# Patient Record
Sex: Male | Born: 1960 | Hispanic: No | Smoking: Current every day smoker
Health system: Southern US, Community
[De-identification: ages and names within clinical notes are randomized; demographics above are authoritative.]

## PROBLEM LIST (undated history)

## (undated) DIAGNOSIS — M1990 Unspecified osteoarthritis, unspecified site: Secondary | ICD-10-CM

## (undated) DIAGNOSIS — S329XXA Fracture of unspecified parts of lumbosacral spine and pelvis, initial encounter for closed fracture: Secondary | ICD-10-CM

## (undated) DIAGNOSIS — K449 Diaphragmatic hernia without obstruction or gangrene: Secondary | ICD-10-CM

## (undated) HISTORY — PX: CERVICAL FUSION: SHX112

---

## 2008-10-10 ENCOUNTER — Inpatient Hospital Stay (HOSPITAL_COMMUNITY): Admission: AD | Admit: 2008-10-10 | Discharge: 2008-10-11 | Payer: Self-pay | Admitting: Cardiology

## 2010-10-10 LAB — MAGNESIUM: Magnesium: 2.3 mg/dL (ref 1.5–2.5)

## 2010-10-10 LAB — D-DIMER, QUANTITATIVE: D-Dimer, Quant: <0.22 ug/mL-FEU (ref 0.00–0.48)

## 2010-10-10 LAB — CBC
HCT: 45.5 % (ref 39.0–52.0)
HCT: 46.1 % (ref 39.0–52.0)
Hemoglobin: 15.8 g/dL (ref 13.0–17.0)
Hemoglobin: 15.8 g/dL (ref 13.0–17.0)
MCHC: 34.4 g/dL (ref 30.0–36.0)
MCHC: 34.7 g/dL (ref 30.0–36.0)
Platelets: 176 10*3/uL (ref 150–400)
RDW: 13 % (ref 11.5–15.5)
RDW: 13.1 % (ref 11.5–15.5)

## 2010-10-10 LAB — HEMOGLOBIN A1C: Mean Plasma Glucose: 108 mg/dL

## 2010-10-10 LAB — LIPID PANEL
Cholesterol: 174 mg/dL (ref 0–200)
LDL Cholesterol: 122 mg/dL — ABNORMAL HIGH (ref 0–99)
Total CHOL/HDL Ratio: 4.5 RATIO

## 2010-10-10 LAB — DIFFERENTIAL
Basophils Relative: 0 % (ref 0–1)
Lymphocytes Relative: 34 % (ref 12–46)
Lymphs Abs: 3.3 10*3/uL (ref 0.7–4.0)
Monocytes Relative: 8 % (ref 3–12)
Neutro Abs: 5.4 10*3/uL (ref 1.7–7.7)
Neutrophils Relative %: 56 % (ref 43–77)

## 2010-10-10 LAB — COMPREHENSIVE METABOLIC PANEL
BUN: 15 mg/dL (ref 6–23)
Calcium: 9.6 mg/dL (ref 8.4–10.5)
Creatinine, Ser: 1.23 mg/dL (ref 0.4–1.5)
Glucose, Bld: 85 mg/dL (ref 70–99)
Total Protein: 6.2 g/dL (ref 6.0–8.3)

## 2010-10-10 LAB — PROTIME-INR: Prothrombin Time: 12.9 seconds (ref 11.6–15.2)

## 2010-10-10 LAB — CARDIAC PANEL(CRET KIN+CKTOT+MB+TROPI)
CK, MB: 2.6 ng/mL (ref 0.3–4.0)
Relative Index: 2.2 (ref 0.0–2.5)
Relative Index: INVALID (ref 0.0–2.5)
Troponin I: <0.01 ng/mL (ref 0.00–0.06)
Troponin I: <0.01 ng/mL (ref 0.00–0.06)

## 2010-10-10 LAB — LIPASE, BLOOD: Lipase: 22 U/L (ref 11–59)

## 2010-10-10 LAB — TSH: TSH: 1.947 u[IU]/mL (ref 0.350–4.500)

## 2010-11-16 NOTE — Discharge Summary (Signed)
Lawrence Ward, Lawrence Ward               ACCOUNT NO.:  [id number]   MEDICAL RECORD NO.:  [id number]          PATIENT TYPE:  INP   LOCATION:  2919                         FACILITY:  [facility]   PHYSICIAN:  Armanda Magic, M.D.     DATE OF BIRTH:  September 07, 1960   DATE OF ADMISSION:  10/10/2008  DATE OF DISCHARGE:  10/11/2008                               DISCHARGE SUMMARY   DISCHARGE DIAGNOSES:  1. Chest pain, negative enzymes, negative Cardiolite, felt to be      noncardiac in nature.  2. Dyslipidemia.  3. History of smoking, smoking cessation counseling.  4. Hiatal hernia.   HOSPITAL COURSE:  [name] is a 50 year old male patient who was seen  in the office on October 10, 2008, as a referral from Dr. Para March with  chest tightness.  He had symptoms earlier in the day that did not  respond to sublingual nitroglycerin or a GI cocktail.  He was still  having chest pain while he was in our office and therefore we admitted  him to the hospital.   Part of his cardiac workup included cardiac isoenzymes, which were  negative.  TSH was 1.947.  Hemoglobin A1c 5.4.  Total cholesterol 174,  triglycerides 64, LDL 122.  Magnesium 2.3.  Serum amylase 51, lipase 22.  Sodium 141, potassium 4.5, BUN 15, creatinine 1.23.  Hemoglobin 15.8,  hematocrit 46.1, white count 9.7, platelets 176.   A stress test was performed and this was normal without any evidence of  exercise-induced myocardial ischemia.  The EF was calculated at 61%.  At  this point, we felt it was safe for the patient to go home.   DISCHARGE MEDICATIONS:  Prilosec over the counter 1 tablet daily.   He is to remain on a low-sodium, heart-healthy, low-fat diet.  Increase  activity slowly.  Return to see Dr. Para March in 2 weeks and he will also  need an appointment with a GI specialist.      Guy Franco, P.A.      Armanda Magic, M.D.  Electronically Signed    LB/MEDQ  D:  12/02/2008  T:  12/03/2008  Job:  [id number]

## 2014-05-18 ENCOUNTER — Encounter (HOSPITAL_COMMUNITY): Payer: Self-pay | Admitting: Neurology

## 2014-05-18 ENCOUNTER — Emergency Department (HOSPITAL_COMMUNITY): Payer: BC Managed Care – PPO

## 2014-05-18 ENCOUNTER — Inpatient Hospital Stay (HOSPITAL_COMMUNITY)
Admission: EM | Admit: 2014-05-18 | Discharge: 2014-05-25 | DRG: 493 | Disposition: A | Discharge status: Rehabilitation Facility | Payer: BC Managed Care – PPO | Attending: Orthopedic Surgery | Admitting: Orthopedic Surgery

## 2014-05-18 ENCOUNTER — Inpatient Hospital Stay (HOSPITAL_COMMUNITY): Payer: BC Managed Care – PPO | Admitting: Certified Registered Nurse Anesthetist

## 2014-05-18 ENCOUNTER — Encounter (HOSPITAL_COMMUNITY): Admission: EM | Disposition: A | Discharge status: Rehabilitation Facility | Payer: Self-pay | Source: Home / Self Care

## 2014-05-18 DIAGNOSIS — S82402A Unspecified fracture of shaft of left fibula, initial encounter for closed fracture: Secondary | ICD-10-CM | POA: Diagnosis present

## 2014-05-18 DIAGNOSIS — S92101A Unspecified fracture of right talus, initial encounter for closed fracture: Secondary | ICD-10-CM | POA: Diagnosis present

## 2014-05-18 DIAGNOSIS — S42002A Fracture of unspecified part of left clavicle, initial encounter for closed fracture: Secondary | ICD-10-CM | POA: Diagnosis present

## 2014-05-18 DIAGNOSIS — F17210 Nicotine dependence, cigarettes, uncomplicated: Secondary | ICD-10-CM | POA: Diagnosis present

## 2014-05-18 DIAGNOSIS — S82302S Unspecified fracture of lower end of left tibia, sequela: Secondary | ICD-10-CM | POA: Diagnosis not present

## 2014-05-18 DIAGNOSIS — S92109A Unspecified fracture of unspecified talus, initial encounter for closed fracture: Secondary | ICD-10-CM

## 2014-05-18 DIAGNOSIS — S81811A Laceration without foreign body, right lower leg, initial encounter: Secondary | ICD-10-CM | POA: Diagnosis present

## 2014-05-18 DIAGNOSIS — S42022A Displaced fracture of shaft of left clavicle, initial encounter for closed fracture: Secondary | ICD-10-CM | POA: Diagnosis present

## 2014-05-18 DIAGNOSIS — M25373 Other instability, unspecified ankle: Secondary | ICD-10-CM

## 2014-05-18 DIAGNOSIS — S92101S Unspecified fracture of right talus, sequela: Secondary | ICD-10-CM | POA: Diagnosis not present

## 2014-05-18 DIAGNOSIS — S2249XA Multiple fractures of ribs, unspecified side, initial encounter for closed fracture: Secondary | ICD-10-CM | POA: Diagnosis present

## 2014-05-18 DIAGNOSIS — M1990 Unspecified osteoarthritis, unspecified site: Secondary | ICD-10-CM | POA: Diagnosis present

## 2014-05-18 DIAGNOSIS — S27329A Contusion of lung, unspecified, initial encounter: Secondary | ICD-10-CM | POA: Diagnosis present

## 2014-05-18 DIAGNOSIS — T1490XA Injury, unspecified, initial encounter: Secondary | ICD-10-CM

## 2014-05-18 DIAGNOSIS — S82873A Displaced pilon fracture of unspecified tibia, initial encounter for closed fracture: Secondary | ICD-10-CM

## 2014-05-18 DIAGNOSIS — S82891A Other fracture of right lower leg, initial encounter for closed fracture: Secondary | ICD-10-CM | POA: Diagnosis present

## 2014-05-18 DIAGNOSIS — S82832S Other fracture of upper and lower end of left fibula, sequela: Secondary | ICD-10-CM | POA: Diagnosis not present

## 2014-05-18 DIAGNOSIS — R52 Pain, unspecified: Secondary | ICD-10-CM

## 2014-05-18 DIAGNOSIS — S2242XA Multiple fractures of ribs, left side, initial encounter for closed fracture: Secondary | ICD-10-CM | POA: Diagnosis present

## 2014-05-18 DIAGNOSIS — M79605 Pain in left leg: Secondary | ICD-10-CM | POA: Diagnosis present

## 2014-05-18 DIAGNOSIS — S2239XA Fracture of one rib, unspecified side, initial encounter for closed fracture: Secondary | ICD-10-CM | POA: Diagnosis present

## 2014-05-18 DIAGNOSIS — S82252A Displaced comminuted fracture of shaft of left tibia, initial encounter for closed fracture: Secondary | ICD-10-CM | POA: Diagnosis present

## 2014-05-18 DIAGNOSIS — S82892A Other fracture of left lower leg, initial encounter for closed fracture: Secondary | ICD-10-CM | POA: Diagnosis present

## 2014-05-18 DIAGNOSIS — S42002S Fracture of unspecified part of left clavicle, sequela: Secondary | ICD-10-CM | POA: Diagnosis not present

## 2014-05-18 DIAGNOSIS — Q899 Congenital malformation, unspecified: Secondary | ICD-10-CM

## 2014-05-18 DIAGNOSIS — T1490 Injury, unspecified: Secondary | ICD-10-CM | POA: Diagnosis not present

## 2014-05-18 DIAGNOSIS — L270 Generalized skin eruption due to drugs and medicaments taken internally: Secondary | ICD-10-CM | POA: Diagnosis not present

## 2014-05-18 HISTORY — DX: Diaphragmatic hernia without obstruction or gangrene: K44.9

## 2014-05-18 HISTORY — DX: Unspecified osteoarthritis, unspecified site: M19.90

## 2014-05-18 HISTORY — PX: EXTERNAL FIXATION LEG: SHX1549

## 2014-05-18 HISTORY — DX: Fracture of unspecified parts of lumbosacral spine and pelvis, initial encounter for closed fracture: S32.9XXA

## 2014-05-18 LAB — COMPREHENSIVE METABOLIC PANEL
ALT: 24 U/L (ref 0–53)
ANION GAP: 18 — AB (ref 5–15)
AST: 25 U/L (ref 0–37)
Albumin: 4 g/dL (ref 3.5–5.2)
Alkaline Phosphatase: 57 U/L (ref 39–117)
BILIRUBIN TOTAL: 0.3 mg/dL (ref 0.3–1.2)
BUN: 15 mg/dL (ref 6–23)
CHLORIDE: 102 meq/L (ref 96–112)
CO2: 19 meq/L (ref 19–32)
Calcium: 9.3 mg/dL (ref 8.4–10.5)
Creatinine, Ser: 0.97 mg/dL (ref 0.50–1.35)
Glucose, Bld: 91 mg/dL (ref 70–99)
Potassium: 3.7 mEq/L (ref 3.7–5.3)
SODIUM: 139 meq/L (ref 137–147)
Total Protein: 6.9 g/dL (ref 6.0–8.3)

## 2014-05-18 LAB — CBC
HCT: 46 % (ref 39.0–52.0)
HEMOGLOBIN: 15.5 g/dL (ref 13.0–17.0)
MCH: 31.7 pg (ref 26.0–34.0)
MCHC: 33.7 g/dL (ref 30.0–36.0)
MCV: 94.1 fL (ref 78.0–100.0)
Platelets: 209 10*3/uL (ref 150–400)
RBC: 4.89 MIL/uL (ref 4.22–5.81)
RDW: 12.6 % (ref 11.5–15.5)
WBC: 12.8 10*3/uL — ABNORMAL HIGH (ref 4.0–10.5)

## 2014-05-18 LAB — PROTIME-INR
INR: 0.98 (ref 0.00–1.49)
PROTHROMBIN TIME: 13.1 s (ref 11.6–15.2)

## 2014-05-18 LAB — ETHANOL

## 2014-05-18 LAB — I-STAT CG4 LACTIC ACID, ED
LACTIC ACID, VENOUS: 2.93 mmol/L — AB (ref 0.5–2.2)
Lactic Acid, Venous: 1.31 mmol/L (ref 0.5–2.2)

## 2014-05-18 LAB — SAMPLE TO BLOOD BANK

## 2014-05-18 LAB — CDS SEROLOGY

## 2014-05-18 SURGERY — EXTERNAL FIXATION, LOWER EXTREMITY
Anesthesia: General | Site: Ankle | Laterality: Left

## 2014-05-18 MED ORDER — ONDANSETRON HCL 4 MG PO TABS: 4.0000 mg | ORAL_TABLET | Freq: Four times a day (QID) | ORAL | Status: DC | PRN

## 2014-05-18 MED ORDER — LIDOCAINE HCL (CARDIAC) 20 MG/ML IV SOLN
INTRAVENOUS | Status: DC | PRN
  Administered 2014-05-18: 60 mg via INTRAVENOUS

## 2014-05-18 MED ORDER — PROPOFOL 10 MG/ML IV BOLUS
INTRAVENOUS | Status: AC
  Filled 2014-05-18: qty 20

## 2014-05-18 MED ORDER — SODIUM CHLORIDE 0.9 % IV BOLUS (SEPSIS)
1000.0000 mL | Freq: Once | INTRAVENOUS | Status: AC
  Administered 2014-05-18: 1000 mL via INTRAVENOUS

## 2014-05-18 MED ORDER — LACTATED RINGERS IV SOLN
INTRAVENOUS | Status: DC | PRN
  Administered 2014-05-18: 23:00:00 via INTRAVENOUS

## 2014-05-18 MED ORDER — DEXAMETHASONE SODIUM PHOSPHATE 4 MG/ML IJ SOLN
INTRAMUSCULAR | Status: DC | PRN
  Administered 2014-05-18: 4 mg via INTRAVENOUS

## 2014-05-18 MED ORDER — IOHEXOL 300 MG/ML  SOLN
100.0000 mL | Freq: Once | INTRAMUSCULAR | Status: AC | PRN
  Administered 2014-05-18: 100 mL via INTRAVENOUS

## 2014-05-18 MED ORDER — OXYCODONE HCL 5 MG/5ML PO SOLN: 5.0000 mg | Freq: Once | ORAL | Status: AC | PRN

## 2014-05-18 MED ORDER — HYDROMORPHONE HCL 1 MG/ML IJ SOLN
1.0000 mg | INTRAMUSCULAR | Status: DC | PRN
  Administered 2014-05-19 (×2): 1 mg via INTRAVENOUS
  Administered 2014-05-19: 2 mg via INTRAVENOUS
  Administered 2014-05-19 (×3): 1 mg via INTRAVENOUS
  Administered 2014-05-20 (×2): 2 mg via INTRAVENOUS
  Filled 2014-05-18 (×4): qty 1
  Filled 2014-05-18 (×3): qty 2
  Filled 2014-05-18: qty 1

## 2014-05-18 MED ORDER — FENTANYL CITRATE 0.05 MG/ML IJ SOLN
INTRAMUSCULAR | Status: DC | PRN
  Administered 2014-05-18: 100 ug via INTRAVENOUS
  Administered 2014-05-18: 50 ug via INTRAVENOUS
  Administered 2014-05-18: 100 ug via INTRAVENOUS
  Administered 2014-05-18 (×3): 50 ug via INTRAVENOUS
  Administered 2014-05-18: 100 ug via INTRAVENOUS

## 2014-05-18 MED ORDER — ENOXAPARIN SODIUM 40 MG/0.4ML ~~LOC~~ SOLN
40.0000 mg | Freq: Every day | SUBCUTANEOUS | Status: DC
  Administered 2014-05-19: 40 mg via SUBCUTANEOUS
  Filled 2014-05-18 (×2): qty 0.4

## 2014-05-18 MED ORDER — 0.9 % SODIUM CHLORIDE (POUR BTL) OPTIME
TOPICAL | Status: DC | PRN
  Administered 2014-05-18: 1000 mL

## 2014-05-18 MED ORDER — PANTOPRAZOLE SODIUM 40 MG IV SOLR
40.0000 mg | Freq: Every day | INTRAVENOUS | Status: DC
  Administered 2014-05-20: 40 mg via INTRAVENOUS
  Filled 2014-05-18 (×4): qty 40

## 2014-05-18 MED ORDER — KCL IN DEXTROSE-NACL 20-5-0.45 MEQ/L-%-% IV SOLN
INTRAVENOUS | Status: DC
Start: 2014-05-19 — End: 2014-05-23
  Administered 2014-05-19 – 2014-05-21 (×2): via INTRAVENOUS
  Filled 2014-05-18 (×5): qty 1000

## 2014-05-18 MED ORDER — CLINDAMYCIN PHOSPHATE 600 MG/50ML IV SOLN
600.0000 mg | Freq: Three times a day (TID) | INTRAVENOUS | Status: AC
  Administered 2014-05-19 (×3): 600 mg via INTRAVENOUS
  Filled 2014-05-18 (×3): qty 50

## 2014-05-18 MED ORDER — FENTANYL CITRATE 0.05 MG/ML IJ SOLN
INTRAMUSCULAR | Status: AC
  Filled 2014-05-18: qty 5

## 2014-05-18 MED ORDER — PROPOFOL 10 MG/ML IV BOLUS
INTRAVENOUS | Status: DC | PRN
Start: 2014-05-18 — End: 2014-05-18
  Administered 2014-05-18: 40 mg via INTRAVENOUS
  Administered 2014-05-18: 160 mg via INTRAVENOUS

## 2014-05-18 MED ORDER — SODIUM CHLORIDE 0.9 % IV SOLN
INTRAVENOUS | Status: DC | PRN
  Administered 2014-05-18: 23:00:00 via INTRAVENOUS

## 2014-05-18 MED ORDER — PANTOPRAZOLE SODIUM 40 MG PO TBEC
40.0000 mg | DELAYED_RELEASE_TABLET | Freq: Every day | ORAL | Status: DC
  Administered 2014-05-19 – 2014-05-23 (×4): 40 mg via ORAL
  Filled 2014-05-18 (×4): qty 1

## 2014-05-18 MED ORDER — MORPHINE SULFATE 4 MG/ML IJ SOLN
4.0000 mg | Freq: Once | INTRAMUSCULAR | Status: AC
  Administered 2014-05-18: 4 mg via INTRAVENOUS
  Filled 2014-05-18: qty 1

## 2014-05-18 MED ORDER — OXYCODONE HCL 5 MG PO TABS: 5.0000 mg | ORAL_TABLET | Freq: Once | ORAL | Status: AC | PRN

## 2014-05-18 MED ORDER — MIDAZOLAM HCL 2 MG/2ML IJ SOLN
INTRAMUSCULAR | Status: AC
  Filled 2014-05-18: qty 2

## 2014-05-18 MED ORDER — ARTIFICIAL TEARS OP OINT
TOPICAL_OINTMENT | OPHTHALMIC | Status: DC | PRN
  Administered 2014-05-18: 1 via OPHTHALMIC

## 2014-05-18 MED ORDER — HYDROMORPHONE HCL 1 MG/ML IJ SOLN: 0.2500 mg | INTRAMUSCULAR | Status: DC | PRN

## 2014-05-18 MED ORDER — SUCCINYLCHOLINE CHLORIDE 20 MG/ML IJ SOLN
INTRAMUSCULAR | Status: DC | PRN
  Administered 2014-05-18: 100 mg via INTRAVENOUS

## 2014-05-18 MED ORDER — ONDANSETRON HCL 4 MG/2ML IJ SOLN: 4.0000 mg | Freq: Four times a day (QID) | INTRAMUSCULAR | Status: DC | PRN

## 2014-05-18 MED ORDER — CLINDAMYCIN PHOSPHATE 600 MG/50ML IV SOLN
600.0000 mg | INTRAVENOUS | Status: AC
Start: 2014-05-18 — End: 2014-05-18
  Administered 2014-05-18: 600 mg via INTRAVENOUS
  Filled 2014-05-18: qty 50

## 2014-05-18 MED ORDER — ONDANSETRON HCL 4 MG/2ML IJ SOLN
INTRAMUSCULAR | Status: DC | PRN
  Administered 2014-05-18: 4 mg via INTRAVENOUS

## 2014-05-18 SURGICAL SUPPLY — 44 items
BAG DECANTER FOR FLEXI CONT (MISCELLANEOUS) IMPLANT
BANDAGE ELASTIC 4 VELCRO ST LF (GAUZE/BANDAGES/DRESSINGS) ×3 IMPLANT
BANDAGE ELASTIC 6 VELCRO ST LF (GAUZE/BANDAGES/DRESSINGS) ×3 IMPLANT
BNDG COHESIVE 4X5 TAN STRL (GAUZE/BANDAGES/DRESSINGS) ×3 IMPLANT
BNDG GAUZE ELAST 4 BULKY (GAUZE/BANDAGES/DRESSINGS) ×3 IMPLANT
CLAMP LG MULTI PIN (Clamp) ×3 IMPLANT
CLAMP ROD ATTACHMENT (Clamp) ×3 IMPLANT
DRAPE EXTREMITY T 121X128X90 (DRAPE) ×6 IMPLANT
DRAPE INCISE IOBAN 66X45 STRL (DRAPES) IMPLANT
DRAPE OEC MINIVIEW 54X84 (DRAPES) IMPLANT
DRAPE U-SHAPE 47X51 STRL (DRAPES) ×3 IMPLANT
DRSG ADAPTIC 3X8 NADH LF (GAUZE/BANDAGES/DRESSINGS) ×3 IMPLANT
DRSG PAD ABDOMINAL 8X10 ST (GAUZE/BANDAGES/DRESSINGS) ×3 IMPLANT
DURAPREP 26ML APPLICATOR (WOUND CARE) ×3 IMPLANT
ELECT REM PT RETURN 9FT ADLT (ELECTROSURGICAL) ×3
ELECTRODE REM PT RTRN 9FT ADLT (ELECTROSURGICAL) ×1 IMPLANT
GLOVE SURG ORTHO 9.0 STRL STRW (GLOVE) ×3 IMPLANT
GOWN STRL REUS W/ TWL XL LVL3 (GOWN DISPOSABLE) ×3 IMPLANT
GOWN STRL REUS W/TWL XL LVL3 (GOWN DISPOSABLE) ×6
KIT BASIN OR (CUSTOM PROCEDURE TRAY) ×3 IMPLANT
KIT ROOM TURNOVER OR (KITS) ×3 IMPLANT
MANIFOLD NEPTUNE II (INSTRUMENTS) ×3 IMPLANT
NS IRRIG 1000ML POUR BTL (IV SOLUTION) ×3 IMPLANT
PACK ORTHO EXTREMITY (CUSTOM PROCEDURE TRAY) ×3 IMPLANT
PAD ABD 8X10 STRL (GAUZE/BANDAGES/DRESSINGS) ×6 IMPLANT
PAD ARMBOARD 7.5X6 YLW CONV (MISCELLANEOUS) ×6 IMPLANT
PAD CAST 4YDX4 CTTN HI CHSV (CAST SUPPLIES) ×1 IMPLANT
PADDING CAST COTTON 4X4 STRL (CAST SUPPLIES) ×2
PENCIL BUTTON HOLSTER BLD 10FT (ELECTRODE) ×3 IMPLANT
PIN SHANZ TRANSFIX 6.0X225 (PIN) ×3 IMPLANT
SCREW SCHNZ SD 5.0 60 THRD/150 (Screw) ×2 IMPLANT
SCRW SCHANZ SD 5.0 60 THRD/150 (Screw) ×6 IMPLANT
SPONGE GAUZE 4X4 12PLY STER LF (GAUZE/BANDAGES/DRESSINGS) ×3 IMPLANT
STOCKINETTE 6  STRL (DRAPES) ×2
STOCKINETTE 6 STRL (DRAPES) ×1 IMPLANT
SUCTION FRAZIER TIP 10 FR DISP (SUCTIONS) ×3 IMPLANT
SUT ETHILON 3 0 FSLX (SUTURE) ×3 IMPLANT
SUT VIC AB 2-0 CTB1 (SUTURE) ×3 IMPLANT
TOWEL OR 17X24 6PK STRL BLUE (TOWEL DISPOSABLE) ×3 IMPLANT
TOWEL OR 17X26 10 PK STRL BLUE (TOWEL DISPOSABLE) ×3 IMPLANT
TUBE CONNECTING 12'X1/4 (SUCTIONS) ×1
TUBE CONNECTING 12X1/4 (SUCTIONS) ×2 IMPLANT
WATER STERILE IRR 1000ML POUR (IV SOLUTION) ×3 IMPLANT
YANKAUER SUCT BULB TIP NO VENT (SUCTIONS) IMPLANT

## 2014-05-18 NOTE — ED Notes (Signed)
Splint applied to left ankle for stabilization by ortho tech.
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
## 2014-05-18 NOTE — Op Note (Signed)
05/18/2014  11:33 PM  PATIENT:  Lawrence Ward    PRE-OPERATIVE DIAGNOSIS:  Left Pilon Fx, right leg laceration to tibia shaft  POST-OPERATIVE DIAGNOSIS:  Same  PROCEDURE:  EXTERNAL FIXATION PILON FX left. Irrigation and debridement of skin soft tissue and bone right leg with loose closure of traumatic wound 3 cm in length.  SURGEON:  Nadara MustardUDA,MARCUS V, MD  PHYSICIAN ASSISTANT:None ANESTHESIA:   General  PREOPERATIVE INDICATIONS:  Lawrence Ward is a  53 y.o. male with a diagnosis of Left Pilon Fx who failed conservative measures and elected for surgical management.    The risks benefits and alternatives were discussed with the patient preoperatively including but not limited to the risks of infection, bleeding, nerve injury, cardiopulmonary complications, the need for revision surgery, among others, and the patient was willing to proceed.  OPERATIVE IMPLANTS: External fixator, delta frame  OPERATIVE FINDINGS: Wound was clean with no foreign material  OPERATIVE PROCEDURE: Patient was brought to the operating room and underwent a general anesthetic. After adequate levels of anesthesia were obtained patient's left lower extremity was prepped using DuraPrep and draped into a sterile field. Prior to surgical prepping and draping the right lower extremity was prepped using Betadine paint the wound was irrigated with saline the wound was loosely closed using staples a sterile dressing was applied. After the left lower extremity was prepped and draped into a sterile field and before the right leg wound was cleansed a timeout was called. 2 pins were placed in the tibia anteriorly a transverse calcaneal pin was placed a delta frame was construct did the pylon fracture was reduced and the frame was tightened. C-arm fluoroscopy verified alignment both AP and lateral planes. A sterile dressing was applied patient was extubated taken to the PACU in stable condition plan for return to the operating room for  internal fixation for the pilon fracture.

## 2014-05-18 NOTE — Progress Notes (Signed)
Orthopedic Tech Progress Note Patient Details:  Azalee Coursehomas Hapke 1960/09/02 [id number]  Ortho Devices Type of Ortho Device: Ace wrap, Stirrup splint, Post (short leg) splint Ortho Device/Splint Location: LLE Ortho Device/Splint Interventions: Ordered, Application   [name], Sahib Pella [name] 05/18/2014, 5:22 PM

## 2014-05-18 NOTE — Anesthesia Preprocedure Evaluation (Signed)
Anesthesia Evaluation  Patient identified by MRN, date of birth, ID band Patient awake    Reviewed: Allergy & Precautions, H&P , NPO status , Patient's Chart, lab work & pertinent test results  Airway Mallampati: II   Neck ROM: limited    Dental   Pulmonary Current Smoker,          Cardiovascular negative cardio ROS      Neuro/Psych  Neuromuscular disease    GI/Hepatic hiatal hernia,   Endo/Other    Renal/GU      Musculoskeletal  (+) Arthritis -,   Abdominal   Peds  Hematology   Anesthesia Other Findings   Reproductive/Obstetrics                             Anesthesia Physical Anesthesia Plan  ASA: II  Anesthesia Plan: General   Post-op Pain Management:    Induction: Intravenous  Airway Management Planned: Oral ETT  Additional Equipment:   Intra-op Plan:   Post-operative Plan: Extubation in OR  Informed Consent: I have reviewed the patients History and Physical, chart, labs and discussed the procedure including the risks, benefits and alternatives for the proposed anesthesia with the patient or authorized representative who has indicated his/her understanding and acceptance.     Plan Discussed with: CRNA, Anesthesiologist and Surgeon  Anesthesia Plan Comments:         Anesthesia Quick Evaluation
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
## 2014-05-18 NOTE — Anesthesia Procedure Notes (Signed)
Procedure Name: Intubation Date/Time: 05/18/2014 11:02 PM Performed by: Julianne RiceBILOTTA, Simrin Vegh Z Pre-anesthesia Checklist: Patient identified, Timeout performed, Emergency Drugs available, Suction available and Patient being monitored Patient Re-evaluated:Patient Re-evaluated prior to inductionOxygen Delivery Method: Circle system utilized Preoxygenation: Pre-oxygenation with 100% oxygen Intubation Type: Rapid sequence and Cricoid Pressure applied Grade View: Grade I Tube type: Oral Tube size: 8.0 mm Number of attempts: 1 Airway Equipment and Method: Stylet and Video-laryngoscopy Placement Confirmation: ETT inserted through vocal cords under direct vision,  breath sounds checked- equal and bilateral,  positive ETCO2 and CO2 detector Secured at: 22 cm Tube secured with: Tape Dental Injury: Teeth and Oropharynx as per pre-operative assessment

## 2014-05-18 NOTE — Transfer of Care (Signed)
Immediate Anesthesia Transfer of Care Note  Patient: Lawrence Ward  Procedure(s) Performed: Procedure(s): EXTERNAL FIXATION PILON FX (Left)  Patient Location: PACU  Anesthesia Type:General  Level of Consciousness: awake, alert  and oriented  Airway & Oxygen Therapy: Patient Spontanous Breathing and Patient connected to face mask oxygen  Post-op Assessment: Report given to PACU RN and Post -op Vital signs reviewed and stable  Post vital signs: Reviewed and stable  Complications: No apparent anesthesia complications

## 2014-05-18 NOTE — ED Notes (Signed)
Pt's girlfriend, Vernona RiegerLaura at bedside.
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
## 2014-05-19 ENCOUNTER — Inpatient Hospital Stay (HOSPITAL_COMMUNITY): Payer: BC Managed Care – PPO

## 2014-05-19 ENCOUNTER — Other Ambulatory Visit (HOSPITAL_COMMUNITY): Payer: Self-pay | Admitting: Orthopedic Surgery

## 2014-05-19 LAB — BASIC METABOLIC PANEL
ANION GAP: 14 (ref 5–15)
BUN: 11 mg/dL (ref 6–23)
CHLORIDE: 107 meq/L (ref 96–112)
CO2: 19 mEq/L (ref 19–32)
Calcium: 8.5 mg/dL (ref 8.4–10.5)
Creatinine, Ser: 0.89 mg/dL (ref 0.50–1.35)
GFR calc non Af Amer: >90 mL/min (ref >90)
Glucose, Bld: 134 mg/dL — ABNORMAL HIGH (ref 70–99)
Potassium: 4.3 mEq/L (ref 3.7–5.3)
SODIUM: 140 meq/L (ref 137–147)

## 2014-05-19 LAB — CBC
HCT: 39.5 % (ref 39.0–52.0)
Hemoglobin: 13.2 g/dL (ref 13.0–17.0)
MCH: 31.4 pg (ref 26.0–34.0)
MCHC: 33.4 g/dL (ref 30.0–36.0)
MCV: 94 fL (ref 78.0–100.0)
PLATELETS: 174 10*3/uL (ref 150–400)
RBC: 4.2 MIL/uL — ABNORMAL LOW (ref 4.22–5.81)
RDW: 12.8 % (ref 11.5–15.5)
WBC: 12.3 10*3/uL — AB (ref 4.0–10.5)

## 2014-05-19 MED ORDER — PNEUMOCOCCAL VAC POLYVALENT 25 MCG/0.5ML IJ INJ
0.5000 mL | INJECTION | INTRAMUSCULAR | Status: AC
  Administered 2014-05-21: 0.5 mL via INTRAMUSCULAR
  Filled 2014-05-19: qty 0.5

## 2014-05-19 MED ORDER — IPRATROPIUM-ALBUTEROL 0.5-2.5 (3) MG/3ML IN SOLN
3.0000 mL | RESPIRATORY_TRACT | Status: DC
  Administered 2014-05-19: 3 mL via RESPIRATORY_TRACT
  Filled 2014-05-19: qty 3

## 2014-05-19 MED ORDER — CHLORHEXIDINE GLUCONATE 4 % EX LIQD
60.0000 mL | Freq: Once | CUTANEOUS | Status: AC
  Administered 2014-05-20: 4 via TOPICAL
  Filled 2014-05-19: qty 60

## 2014-05-19 MED ORDER — CLINDAMYCIN PHOSPHATE 900 MG/50ML IV SOLN
900.0000 mg | INTRAVENOUS | Status: DC
  Filled 2014-05-19 (×2): qty 50

## 2014-05-19 MED ORDER — CLINDAMYCIN PHOSPHATE 900 MG/50ML IV SOLN
900.0000 mg | INTRAVENOUS | Status: DC
Start: 2014-05-19 — End: 2014-05-19
  Filled 2014-05-19: qty 50

## 2014-05-19 MED ORDER — OXYCODONE-ACETAMINOPHEN 5-325 MG PO TABS
1.0000 | ORAL_TABLET | ORAL | Status: DC | PRN
  Administered 2014-05-19 – 2014-05-20 (×4): 2 via ORAL
  Filled 2014-05-19 (×4): qty 2

## 2014-05-19 MED ORDER — MIDAZOLAM HCL 5 MG/5ML IJ SOLN
INTRAMUSCULAR | Status: DC | PRN
  Administered 2014-05-18: 1 mg via INTRAVENOUS

## 2014-05-19 MED ORDER — IPRATROPIUM-ALBUTEROL 0.5-2.5 (3) MG/3ML IN SOLN: 3.0000 mL | RESPIRATORY_TRACT | Status: DC | PRN

## 2014-05-19 NOTE — Progress Notes (Addendum)
Patient ID: Lawrence Ward, male   DOB: Jul 08, 1960, 53 y.o.   MRN: [id number] Postoperative day 1 status post external fixation for comminuted left pilon fracture. Patient moves his toes well. Plan for revision surgery on Friday afternoon to convert to internal fixation on the left. Patient may be weightbearing as tolerated on the right with small avulsion chip fractures of the medial and lateral malleolus.  Plain radiographs of the right ankle talar body appears to have a vertical fracture. We'll order a CT scan of the right ankle and hindfoot.

## 2014-05-19 NOTE — Progress Notes (Signed)
RT assessment done at this time. BBS clear and demised. Patient stated he has had no shortness of breath other than with pain, and does not feel as if he needs nebs. Chest xray showed atelectasis so I encouraged IS. RT to change to PRN. Will monitor as needed

## 2014-05-19 NOTE — Progress Notes (Signed)
UR completed.  Zahriah Roes, RN BSN [facility] CCM Trauma/Neuro ICU Case Manager [phone]  
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
## 2014-05-19 NOTE — Progress Notes (Signed)
PT Cancellation Note  Patient Details Name: Lawrence Ward MRN: [id number] DOB: 08-14-1960   Cancelled Treatment:    Reason Eval/Treat Not Completed: Patient not medically ready Pt on bedrest. Awaiting CT scan of right foot and increase of activity orders prior to PT evaluation.    Alvie HeidelbergFolan, Benuel Ly A 05/19/2014, 8:58 AM Alvie HeidelbergShauna Folan, PT, DPT [phone]

## 2014-05-19 NOTE — Anesthesia Postprocedure Evaluation (Signed)
Anesthesia Post Note  Patient: Lawrence Ward  Procedure(s) Performed: Procedure(s) (LRB): EXTERNAL FIXATION PILON FX (Left)  Anesthesia type: General  Patient location: PACU  Post pain: Pain level controlled and Adequate analgesia  Post assessment: Post-op Vital signs reviewed, Patient's Cardiovascular Status Stable, Respiratory Function Stable, Patent Airway and Pain level controlled  Last Vitals:  Filed Vitals:   05/19/14 0026  BP:   Pulse: 97  Temp: 36.7 C  Resp: 17    Post vital signs: Reviewed and stable  Level of consciousness: awake, alert  and oriented  Complications: No apparent anesthesia complications

## 2014-05-19 NOTE — Plan of Care (Signed)
Problem: Phase I Progression Outcomes Goal: Sutures/staples intact Outcome: Completed/Met Date Met:  05/19/14 Goal: Tubes/drains patent Outcome: Completed/Met Date Met:  05/19/14

## 2014-05-19 NOTE — Plan of Care (Signed)
Problem: Consults Goal: Nutrition Consult-if indicated Outcome: Not Applicable Date Met:  09/31/12 Goal: Diabetes Guidelines if Diabetic/Glucose > 140 If diabetic or lab glucose is > 140 mg/dl - Initiate Diabetes/Hyperglycemia Guidelines & Document Interventions  Outcome: Not Applicable Date Met:  16/24/46  Problem: Phase I Progression Outcomes Goal: Voiding-avoid urinary catheter unless indicated Outcome: Completed/Met Date Met:  05/19/14 Goal: Other Phase I Outcomes/Goals Outcome: Completed/Met Date Met:  05/19/14  Problem: Phase II Progression Outcomes Goal: Progressing with IS, TCDB Outcome: Completed/Met Date Met:  05/19/14 Goal: Vital signs stable Outcome: Completed/Met Date Met:  05/19/14 Goal: Surgical site without signs of infection Outcome: Completed/Met Date Met:  05/19/14 Goal: Foley discontinued Outcome: Completed/Met Date Met:  05/19/14 Goal: Tolerating diet Outcome: Completed/Met Date Met:  05/19/14  Problem: Phase III Progression Outcomes Goal: Voiding independently Outcome: Completed/Met Date Met:  05/19/14 Goal: Nasogastric tube discontinued Outcome: Not Applicable Date Met:  95/07/22 Goal: Demonstrates TCDB, IS independently Outcome: Completed/Met Date Met:  05/19/14 Goal: Other Phase III Outcomes/Goals Outcome: Completed/Met Date Met:  05/19/14

## 2014-05-20 ENCOUNTER — Inpatient Hospital Stay (HOSPITAL_COMMUNITY): Payer: BC Managed Care – PPO | Admitting: Anesthesiology

## 2014-05-20 ENCOUNTER — Encounter (HOSPITAL_COMMUNITY): Admission: EM | Disposition: A | Discharge status: Rehabilitation Facility | Payer: Self-pay | Source: Home / Self Care

## 2014-05-20 ENCOUNTER — Encounter (HOSPITAL_COMMUNITY): Payer: Self-pay | Admitting: Anesthesiology

## 2014-05-20 DIAGNOSIS — S42002A Fracture of unspecified part of left clavicle, initial encounter for closed fracture: Secondary | ICD-10-CM | POA: Diagnosis present

## 2014-05-20 DIAGNOSIS — S82891A Other fracture of right lower leg, initial encounter for closed fracture: Secondary | ICD-10-CM | POA: Diagnosis present

## 2014-05-20 DIAGNOSIS — S82892A Other fracture of left lower leg, initial encounter for closed fracture: Secondary | ICD-10-CM | POA: Diagnosis present

## 2014-05-20 HISTORY — PX: ORIF ANKLE FRACTURE: SHX5408

## 2014-05-20 HISTORY — PX: OPEN REDUCTION INTERNAL FIXATION (ORIF) FOOT LISFRANC FRACTURE: SHX5990

## 2014-05-20 LAB — SURGICAL PCR SCREEN
MRSA, PCR: INVALID — AB
Staphylococcus aureus: INVALID — AB

## 2014-05-20 SURGERY — OPEN REDUCTION INTERNAL FIXATION (ORIF) ANKLE FRACTURE
Anesthesia: General | Site: Ankle | Laterality: Right

## 2014-05-20 MED ORDER — METOCLOPRAMIDE HCL 5 MG/ML IJ SOLN: 5.0000 mg | Freq: Three times a day (TID) | INTRAMUSCULAR | Status: DC | PRN

## 2014-05-20 MED ORDER — SODIUM CHLORIDE 0.9 % IV SOLN: INTRAVENOUS | Status: DC

## 2014-05-20 MED ORDER — HYDROMORPHONE HCL 1 MG/ML IJ SOLN
INTRAMUSCULAR | Status: AC
  Filled 2014-05-20: qty 1

## 2014-05-20 MED ORDER — OXYCODONE-ACETAMINOPHEN 5-325 MG PO TABS
1.0000 | ORAL_TABLET | ORAL | Status: DC | PRN
  Administered 2014-05-21 – 2014-05-25 (×21): 2 via ORAL
  Filled 2014-05-20 (×22): qty 2

## 2014-05-20 MED ORDER — ROCURONIUM BROMIDE 50 MG/5ML IV SOLN
INTRAVENOUS | Status: AC
  Filled 2014-05-20: qty 1

## 2014-05-20 MED ORDER — NEOSTIGMINE METHYLSULFATE 10 MG/10ML IV SOLN
INTRAVENOUS | Status: DC | PRN
  Administered 2014-05-20: 2 mg via INTRAVENOUS

## 2014-05-20 MED ORDER — PROPOFOL 10 MG/ML IV BOLUS
INTRAVENOUS | Status: DC | PRN
  Administered 2014-05-20: 190 mg via INTRAVENOUS

## 2014-05-20 MED ORDER — HYDROMORPHONE HCL 1 MG/ML IJ SOLN
0.5000 mg | INTRAMUSCULAR | Status: DC | PRN
  Administered 2014-05-21 – 2014-05-22 (×2): 1 mg via INTRAVENOUS
  Filled 2014-05-20 (×2): qty 1

## 2014-05-20 MED ORDER — METOCLOPRAMIDE HCL 10 MG PO TABS: 5.0000 mg | ORAL_TABLET | Freq: Three times a day (TID) | ORAL | Status: DC | PRN

## 2014-05-20 MED ORDER — CLINDAMYCIN PHOSPHATE 600 MG/50ML IV SOLN
600.0000 mg | Freq: Four times a day (QID) | INTRAVENOUS | Status: AC
  Administered 2014-05-20 – 2014-05-21 (×3): 600 mg via INTRAVENOUS
  Filled 2014-05-20 (×3): qty 50

## 2014-05-20 MED ORDER — FENTANYL CITRATE 0.05 MG/ML IJ SOLN
INTRAMUSCULAR | Status: AC
  Filled 2014-05-20: qty 5

## 2014-05-20 MED ORDER — LIDOCAINE HCL (CARDIAC) 20 MG/ML IV SOLN
INTRAVENOUS | Status: DC | PRN
  Administered 2014-05-20: 50 mg via INTRAVENOUS

## 2014-05-20 MED ORDER — GLYCOPYRROLATE 0.2 MG/ML IJ SOLN
INTRAMUSCULAR | Status: DC | PRN
  Administered 2014-05-20: 0.4 mg via INTRAVENOUS

## 2014-05-20 MED ORDER — MIDAZOLAM HCL 2 MG/2ML IJ SOLN
INTRAMUSCULAR | Status: AC
  Filled 2014-05-20: qty 2

## 2014-05-20 MED ORDER — OXYCODONE HCL 5 MG PO TABS: 5.0000 mg | ORAL_TABLET | Freq: Once | ORAL | Status: AC | PRN

## 2014-05-20 MED ORDER — PROPOFOL 10 MG/ML IV BOLUS
INTRAVENOUS | Status: AC
  Filled 2014-05-20: qty 20

## 2014-05-20 MED ORDER — ONDANSETRON HCL 4 MG/2ML IJ SOLN: 4.0000 mg | Freq: Four times a day (QID) | INTRAMUSCULAR | Status: DC | PRN

## 2014-05-20 MED ORDER — LACTATED RINGERS IV SOLN
INTRAVENOUS | Status: DC | PRN
  Administered 2014-05-20 (×2): via INTRAVENOUS

## 2014-05-20 MED ORDER — ONDANSETRON HCL 4 MG/2ML IJ SOLN
INTRAMUSCULAR | Status: DC | PRN
  Administered 2014-05-20: 4 mg via INTRAVENOUS

## 2014-05-20 MED ORDER — METHOCARBAMOL 500 MG PO TABS
1000.0000 mg | ORAL_TABLET | Freq: Three times a day (TID) | ORAL | Status: DC | PRN
  Administered 2014-05-20 – 2014-05-23 (×4): 1000 mg via ORAL
  Filled 2014-05-20 (×6): qty 2

## 2014-05-20 MED ORDER — FENTANYL CITRATE 0.05 MG/ML IJ SOLN
INTRAMUSCULAR | Status: DC | PRN
  Administered 2014-05-20 (×2): 50 ug via INTRAVENOUS
  Administered 2014-05-20: 100 ug via INTRAVENOUS
  Administered 2014-05-20 (×2): 50 ug via INTRAVENOUS

## 2014-05-20 MED ORDER — NEOSTIGMINE METHYLSULFATE 10 MG/10ML IV SOLN
INTRAVENOUS | Status: AC
  Filled 2014-05-20: qty 1

## 2014-05-20 MED ORDER — DEXAMETHASONE SODIUM PHOSPHATE 4 MG/ML IJ SOLN
INTRAMUSCULAR | Status: DC | PRN
  Administered 2014-05-20: 4 mg via INTRAVENOUS

## 2014-05-20 MED ORDER — ACETAMINOPHEN 10 MG/ML IV SOLN
INTRAVENOUS | Status: DC | PRN
  Administered 2014-05-20: 1000 mg via INTRAVENOUS

## 2014-05-20 MED ORDER — PHENYLEPHRINE HCL 10 MG/ML IJ SOLN
INTRAMUSCULAR | Status: DC | PRN
  Administered 2014-05-20: 80 ug via INTRAVENOUS

## 2014-05-20 MED ORDER — OXYCODONE HCL 5 MG/5ML PO SOLN: 5.0000 mg | Freq: Once | ORAL | Status: AC | PRN

## 2014-05-20 MED ORDER — BIOTENE DRY MOUTH MT LIQD: 15.0000 mL | OROMUCOSAL | Status: DC | PRN

## 2014-05-20 MED ORDER — 0.9 % SODIUM CHLORIDE (POUR BTL) OPTIME
TOPICAL | Status: DC | PRN
  Administered 2014-05-20: 1000 mL

## 2014-05-20 MED ORDER — CLINDAMYCIN PHOSPHATE 900 MG/50ML IV SOLN
INTRAVENOUS | Status: DC | PRN
  Administered 2014-05-20: 900 mg via INTRAVENOUS

## 2014-05-20 MED ORDER — METHOCARBAMOL 500 MG PO TABS
500.0000 mg | ORAL_TABLET | Freq: Four times a day (QID) | ORAL | Status: DC | PRN
  Administered 2014-05-21 – 2014-05-25 (×10): 500 mg via ORAL
  Filled 2014-05-20 (×9): qty 1

## 2014-05-20 MED ORDER — HYDROMORPHONE HCL 1 MG/ML IJ SOLN
0.2500 mg | INTRAMUSCULAR | Status: DC | PRN
  Administered 2014-05-20 (×3): 0.5 mg via INTRAVENOUS

## 2014-05-20 MED ORDER — DEXMEDETOMIDINE HCL IN NACL 200 MCG/50ML IV SOLN
INTRAVENOUS | Status: DC | PRN
  Administered 2014-05-20: 0.2 ug/kg/h via INTRAVENOUS

## 2014-05-20 MED ORDER — ENOXAPARIN SODIUM 40 MG/0.4ML ~~LOC~~ SOLN
40.0000 mg | Freq: Every day | SUBCUTANEOUS | Status: DC
  Administered 2014-05-21 – 2014-05-25 (×5): 40 mg via SUBCUTANEOUS
  Filled 2014-05-20 (×5): qty 0.4

## 2014-05-20 MED ORDER — METHOCARBAMOL 1000 MG/10ML IJ SOLN
500.0000 mg | Freq: Four times a day (QID) | INTRAVENOUS | Status: DC | PRN
  Filled 2014-05-20: qty 5

## 2014-05-20 MED ORDER — DOCUSATE SODIUM 100 MG PO CAPS
100.0000 mg | ORAL_CAPSULE | Freq: Two times a day (BID) | ORAL | Status: DC
  Administered 2014-05-20 – 2014-05-23 (×6): 100 mg via ORAL
  Filled 2014-05-20 (×8): qty 1

## 2014-05-20 MED ORDER — ONDANSETRON HCL 4 MG PO TABS: 4.0000 mg | ORAL_TABLET | Freq: Four times a day (QID) | ORAL | Status: DC | PRN

## 2014-05-20 MED ORDER — ARTIFICIAL TEARS OP OINT
TOPICAL_OINTMENT | OPHTHALMIC | Status: DC | PRN
  Administered 2014-05-20: 1 via OPHTHALMIC

## 2014-05-20 MED ORDER — ACETAMINOPHEN 10 MG/ML IV SOLN
INTRAVENOUS | Status: AC
  Filled 2014-05-20: qty 100

## 2014-05-20 MED ORDER — GLYCOPYRROLATE 0.2 MG/ML IJ SOLN
INTRAMUSCULAR | Status: AC
  Filled 2014-05-20: qty 2

## 2014-05-20 MED ORDER — ROCURONIUM BROMIDE 100 MG/10ML IV SOLN
INTRAVENOUS | Status: DC | PRN
  Administered 2014-05-20: 50 mg via INTRAVENOUS

## 2014-05-20 SURGICAL SUPPLY — 67 items
BANDAGE ESMARK 6X9 LF (GAUZE/BANDAGES/DRESSINGS) IMPLANT
BIT DRILL 2.5X110 QC LCP DISP (BIT) ×4 IMPLANT
BIT DRILL LCP QC 2X140 (BIT) ×4 IMPLANT
BNDG COHESIVE 4X5 TAN STRL (GAUZE/BANDAGES/DRESSINGS) ×4 IMPLANT
BNDG COHESIVE 6X5 TAN STRL LF (GAUZE/BANDAGES/DRESSINGS) ×4 IMPLANT
BNDG ESMARK 6X9 LF (GAUZE/BANDAGES/DRESSINGS)
BNDG GAUZE ELAST 4 BULKY (GAUZE/BANDAGES/DRESSINGS) ×4 IMPLANT
CANISTER WOUND CARE 500ML ATS (WOUND CARE) ×4 IMPLANT
COVER SURGICAL LIGHT HANDLE (MISCELLANEOUS) ×4 IMPLANT
CUFF TOURNIQUET SINGLE 34IN LL (TOURNIQUET CUFF) IMPLANT
CUFF TOURNIQUET SINGLE 44IN (TOURNIQUET CUFF) IMPLANT
DRAPE EXTREMITY BILATERAL (DRAPE) ×4 IMPLANT
DRAPE INCISE IOBAN 66X45 STRL (DRAPES) ×4 IMPLANT
DRAPE OEC MINIVIEW 54X84 (DRAPES) ×4 IMPLANT
DRAPE PROXIMA HALF (DRAPES) ×4 IMPLANT
DRAPE U-SHAPE 47X51 STRL (DRAPES) ×4 IMPLANT
DRSG ADAPTIC 3X8 NADH LF (GAUZE/BANDAGES/DRESSINGS) ×4 IMPLANT
DRSG MEPILEX BORDER 4X4 (GAUZE/BANDAGES/DRESSINGS) ×20 IMPLANT
DRSG VAC ATS MED SENSATRAC (GAUZE/BANDAGES/DRESSINGS) ×4 IMPLANT
DURAPREP 26ML APPLICATOR (WOUND CARE) ×8 IMPLANT
ELECT REM PT RETURN 9FT ADLT (ELECTROSURGICAL) ×4
ELECTRODE REM PT RTRN 9FT ADLT (ELECTROSURGICAL) ×2 IMPLANT
GAUZE SPONGE 4X4 12PLY STRL (GAUZE/BANDAGES/DRESSINGS) ×4 IMPLANT
GLOVE BIO SURGEON STRL SZ 6.5 (GLOVE) ×9 IMPLANT
GLOVE BIO SURGEONS STRL SZ 6.5 (GLOVE) ×3
GLOVE BIOGEL PI IND STRL 6.5 (GLOVE) ×4 IMPLANT
GLOVE BIOGEL PI IND STRL 7.5 (GLOVE) ×4 IMPLANT
GLOVE BIOGEL PI IND STRL 9 (GLOVE) ×2 IMPLANT
GLOVE BIOGEL PI INDICATOR 6.5 (GLOVE) ×4
GLOVE BIOGEL PI INDICATOR 7.5 (GLOVE) ×4
GLOVE BIOGEL PI INDICATOR 9 (GLOVE) ×2
GLOVE SURG ORTHO 9.0 STRL STRW (GLOVE) ×12 IMPLANT
GLOVE SURG SS PI 6.0 STRL IVOR (GLOVE) ×4 IMPLANT
GOWN STRL REUS W/ TWL XL LVL3 (GOWN DISPOSABLE) ×8 IMPLANT
GOWN STRL REUS W/TWL XL LVL3 (GOWN DISPOSABLE) ×8
GUIDEWIRE NON THREAD 1.6MM (WIRE) ×4 IMPLANT
KIT BASIN OR (CUSTOM PROCEDURE TRAY) ×4 IMPLANT
KIT ROOM TURNOVER OR (KITS) ×4 IMPLANT
MANIFOLD NEPTUNE II (INSTRUMENTS) ×4 IMPLANT
MIX DBX 10CC 35% BONE (Bone Implant) ×4 IMPLANT
NS IRRIG 1000ML POUR BTL (IV SOLUTION) ×4 IMPLANT
PACK ORTHO EXTREMITY (CUSTOM PROCEDURE TRAY) ×4 IMPLANT
PAD ARMBOARD 7.5X6 YLW CONV (MISCELLANEOUS) ×8 IMPLANT
PADDING CAST COTTON 6X4 STRL (CAST SUPPLIES) IMPLANT
PLATE DIST TIBIA 6H 2.7/3.5MM (Plate) ×4 IMPLANT
PLATE LCP 3.5 1/3 TUB 5HX57 (Plate) ×8 IMPLANT
SCREW COMP HEADLESS 4.5X54 (Screw) ×4 IMPLANT
SCREW CORTEX 3.5 12MM (Screw) ×6 IMPLANT
SCREW CORTEX LOW PRO 3.5X30 (Screw) ×8 IMPLANT
SCREW CORTEX LOW PRO 3.5X32 (Screw) ×4 IMPLANT
SCREW LOCK CORT ST 3.5X12 (Screw) ×6 IMPLANT
SCREW LOCK T15 FT 10X3.5X2.9X (Screw) ×4 IMPLANT
SCREW LOCK T15 FT 12X3.5X2.9X (Screw) ×4 IMPLANT
SCREW LOCKING 3.5X10 (Screw) ×4 IMPLANT
SCREW LOCKING 3.5X12 (Screw) ×4 IMPLANT
SCREW LOCKING VA 2.7X40MM (Screw) ×4 IMPLANT
SCREW LOCKING VA 2.7X50MM (Screw) ×20 IMPLANT
SPONGE LAP 18X18 X RAY DECT (DISPOSABLE) ×4 IMPLANT
STAPLER VISISTAT 35W (STAPLE) IMPLANT
SUCTION FRAZIER TIP 10 FR DISP (SUCTIONS) ×8 IMPLANT
SUT ETHILON 2 0 PSLX (SUTURE) ×16 IMPLANT
SUT VIC AB 2-0 CTB1 (SUTURE) IMPLANT
TOWEL OR 17X24 6PK STRL BLUE (TOWEL DISPOSABLE) ×4 IMPLANT
TOWEL OR 17X26 10 PK STRL BLUE (TOWEL DISPOSABLE) ×4 IMPLANT
TUBE CONNECTING 12'X1/4 (SUCTIONS) ×2
TUBE CONNECTING 12X1/4 (SUCTIONS) ×6 IMPLANT
WIRE COMPR 60 THRD 2.8X200 (WIRE) ×20 IMPLANT

## 2014-05-20 NOTE — Progress Notes (Signed)
OT Cancellation Note  Patient Details Name: Azalee Coursehomas Maden MRN: [id number] DOB: 1960-11-08   Cancelled Treatment:    Reason Eval/Treat Not Completed: Patient at procedure or test/ unavailable. Pt in OR at this time. OT will follow up post-op as available to complete evaluation.   Nena JordanMiller, Waleska Buttery M   Carney LivingLeeAnn Marie Clois Montavon, OTR/L Occupational Therapist [phone])  05/20/2014, 3:27 PM

## 2014-05-20 NOTE — Anesthesia Preprocedure Evaluation (Addendum)
Anesthesia Evaluation  Patient identified by MRN, date of birth, ID band Patient awake    Reviewed: Allergy & Precautions, H&P , NPO status , Patient's Chart, lab work & pertinent test results  Airway Mallampati: II   Neck ROM: full    Dental   Pulmonary Current Smoker,          Cardiovascular negative cardio ROS   RADIOLOGY REPORT*  Clinical Data: Chest pain with family history of coronary artery disease. History of smoking and shortness of breath.  MYOCARDIAL IMAGING WITH SPECT (REST AND EXERCISE) GATED LEFT VENTRICULAR WALL MOTION STUDY LEFT VENTRICULAR EJECTION FRACTION  Technique: Standard myocardial SPECT imaging was performed after resting intravenous injection of Tc-888m Myoview. Subsequently, exercise tolerance test was performed by the patient under the supervision of the Cardiology staff. At peak-stress, Tc-778m Myoview was injected intravenously and standard myocardial SPECT imaging was performed. Quantitative gated imaging was also performed to evaluate left ventricular wall motion, and estimate left ventricular ejection fraction.  Radiopharmaceutical: Tc-978m Myoview 10 mCi at rest and 30 mCi during stress.  Comparison: None  Findings: SPECT images demonstrate normal left ventricular activity. There are no fixed or reversible perfusion defects.  Gated cine images were reviewed on the workstation and demonstrate normal left ventricular wall motion and systolic thickening. The QGS ejection fraction measured at rest is 61% with an end diastolic volume of 99 ml and an end-systolic volume of 39 ml.  IMPRESSION: Normal examination without evidence of exercise induced myocardial ischemia. The calculated left ventricular ejection fraction is 61%.   Neuro/Psych  Neuromuscular disease    GI/Hepatic hiatal hernia,   Endo/Other    Renal/GU      Musculoskeletal  (+) Arthritis -,   Abdominal   Peds  Hematology   Anesthesia Other Findings   Reproductive/Obstetrics                            Anesthesia Physical Anesthesia Plan  ASA: II  Anesthesia Plan: General   Post-op Pain Management:    Induction: Intravenous  Airway Management Planned: Oral ETT  Additional Equipment:   Intra-op Plan:   Post-operative Plan: Extubation in OR  Informed Consent: I have reviewed the patients History and Physical, chart, labs and discussed the procedure including the risks, benefits and alternatives for the proposed anesthesia with the patient or authorized representative who has indicated his/her understanding and acceptance.     Plan Discussed with: CRNA, Anesthesiologist and Surgeon  Anesthesia Plan Comments:         Anesthesia Quick Evaluation

## 2014-05-20 NOTE — Interval H&P Note (Signed)
History and Physical Interval Note:  05/20/2014 6:45 AM  Lawrence Ward  has presented today for surgery, with the diagnosis of Left Pilon Fracture  The various methods of treatment have been discussed with the patient and family. After consideration of risks, benefits and other options for treatment, the patient has consented to  Procedure(s): Open Reduction Internal Fixation Pilon Fracture (Left) OPEN REDUCTION INTERNAL FIXATION (ORIF) RIGHT FOOT LISFRANC FRACTURE (Right) as a surgical intervention .  The patient's history has been reviewed, patient examined, no change in status, stable for surgery.  I have reviewed the patient's chart and labs.  Questions were answered to the patient's satisfaction.     Catlin Doria V

## 2014-05-20 NOTE — Transfer of Care (Signed)
Immediate Anesthesia Transfer of Care Note  Patient: Lawrence Ward  Procedure(s) Performed: Procedure(s): Open Reduction Internal Fixation Pilon Fracture (Left) OPEN REDUCTION INTERNAL FIXATION (ORIF) RIGHT FOOT LISFRANC FRACTURE (Right)  Patient Location: PACU  Anesthesia Type:General  Level of Consciousness: awake and alert   Airway & Oxygen Therapy: Patient Spontanous Breathing  Post-op Assessment: Report given to PACU RN  Post vital signs: Reviewed  Complications: No apparent anesthesia complications

## 2014-05-20 NOTE — Progress Notes (Signed)
OT Cancellation Note  Patient Details Name: Lawrence Ward MRN: [id number] DOB: [birth year]   Cancelled Treatment:    Reason Eval/Treat Not Completed: Patient not medically ready. Pt still with active bedrest orders. Awaiting CT scan results. OT will follow up as available to complete evaluation.   Nena JordanMiller, Kenyanna Grzesiak M   Carney LivingLeeAnn Marie Mariyam Remington, OTR/L Occupational Therapist [phone] (pager)  05/20/2014, 8:04 AM

## 2014-05-20 NOTE — Progress Notes (Signed)
PCR swab x2 came back from lab as invalid. Per lab nasal culture will be sent to outside lab. Pending results at this time. Nursing will continue to monitor.
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
## 2014-05-20 NOTE — H&P (View-Only) (Signed)
Patient ID: Lawrence Ward, male   DOB: 09/21/1960, 53 y.o.   MRN: [id number] Postoperative day 1 status post external fixation for comminuted left pilon fracture. Patient moves his toes well. Plan for revision surgery on Friday afternoon to convert to internal fixation on the left. Patient may be weightbearing as tolerated on the right with small avulsion chip fractures of the medial and lateral malleolus.  Plain radiographs of the right ankle talar body appears to have a vertical fracture. We'll order a CT scan of the right ankle and hindfoot. 

## 2014-05-20 NOTE — Anesthesia Postprocedure Evaluation (Signed)
  Anesthesia Post-op Note  Patient: Lawrence Ward  Procedure(s) Performed: Procedure(s): Open Reduction Internal Fixation Pilon Fracture (Left) OPEN REDUCTION INTERNAL FIXATION (ORIF) RIGHT FOOT LISFRANC FRACTURE (Right)  Patient Location: PACU  Anesthesia Type: General   Level of Consciousness: awake, alert  and oriented  Airway and Oxygen Therapy: Patient Spontanous Breathing  Post-op Pain: none  Post-op Assessment: Post-op Vital signs reviewed  Post-op Vital Signs: Reviewed  Last Vitals:  Filed Vitals:   05/20/14 1815  BP: 138/76  Pulse:   Temp:   Resp: 21    Complications: No apparent anesthesia complications
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
## 2014-05-21 MED ORDER — POLYETHYLENE GLYCOL 3350 17 G PO PACK
17.0000 g | PACK | Freq: Every day | ORAL | Status: DC
  Administered 2014-05-21 – 2014-05-25 (×5): 17 g via ORAL
  Filled 2014-05-21 (×5): qty 1

## 2014-05-21 NOTE — Progress Notes (Signed)
PT Cancellation Note  Patient Details Name: Lawrence Ward MRN: [id number] DOB: [birth year]   Cancelled Treatment:    Reason Eval/Treat Not Completed: Patient not medically ready;Other (comment) (Active bed rest order in chart, seeking clarification.)   [name], [name] 05/21/2014, 4:04 PM

## 2014-05-21 NOTE — Progress Notes (Signed)
VAC with good seal and suction Foot wwp Dressings c/d/i NWB LLE Up with PT  N. Glee ArvinMichael Malinda Mayden, MD [facility] [phone] 8:16 AM

## 2014-05-21 NOTE — Progress Notes (Signed)
OT Cancellation Note  Patient Details Name: Lawrence Ward MRN: [id number] DOB: 1960/08/20   Cancelled Treatment:    Reason Eval/Treat Not Completed: Patient not medically ready. Pt currently has active bedrest orders. Please d/c bedrest orders for therapy to evaluate pt. PT/OT also need clarified orders for RLE WB (TDWB vs. WBAT for transfers only). Thank you.    Nena JordanMiller, Janette Harvie M   Carney LivingLeeAnn Marie Jaxton Casale, OTR/L Occupational Therapist [phone] (pager)  05/21/2014, 3:59 PM
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
## 2014-05-22 LAB — MRSA CULTURE

## 2014-05-22 MED ORDER — MAGNESIUM HYDROXIDE 400 MG/5ML PO SUSP
30.0000 mL | Freq: Every day | ORAL | Status: DC
  Administered 2014-05-23 – 2014-05-25 (×3): 30 mL via ORAL
  Filled 2014-05-22 (×3): qty 30
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
## 2014-05-22 NOTE — Progress Notes (Signed)
OT Cancellation Note  Patient Details Name: Lawrence Ward MRN: [id number] DOB: Feb 20, 1961   Cancelled Treatment:    Reason Eval/Treat Not Completed: Patient not medically ready. Bedrest order in chart. Please update activity order when pt appropriate for OT evaluation.  Earlie RavelingStraub, Reatha Sur L OTR/L [phone] 05/22/2014, 8:38 AM
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
## 2014-05-22 NOTE — Plan of Care (Signed)
Problem: Phase I Progression Outcomes Goal: Pain controlled with appropriate interventions Outcome: Progressing Goal: Incision/dressings dry and intact Outcome: Progressing  Problem: Phase II Progression Outcomes Goal: Pain controlled Outcome: Progressing Goal: Dressings dry/intact Outcome: Progressing

## 2014-05-23 ENCOUNTER — Encounter (HOSPITAL_COMMUNITY): Payer: Self-pay | Admitting: Physical Medicine and Rehabilitation

## 2014-05-23 MED ORDER — BISACODYL 10 MG RE SUPP
10.0000 mg | Freq: Every day | RECTAL | Status: DC
  Filled 2014-05-23: qty 1

## 2014-05-23 MED ORDER — SILVER SULFADIAZINE 1 % EX CREA
TOPICAL_CREAM | Freq: Every day | CUTANEOUS | Status: DC
  Administered 2014-05-23 – 2014-05-25 (×2): via TOPICAL
  Filled 2014-05-23: qty 85

## 2014-05-23 NOTE — Progress Notes (Signed)
Orthopedic Tech Progress Note Patient Details:  Lawrence Ward 07-05-1960 [id number]  Ortho Devices Type of Ortho Device: CAM walker Ortho Device/Splint Location: trapeze bar patient helper Ortho Device/Splint Interventions: Application   Lawrence Ward, Lawrence Ward 05/23/2014, 4:51 PM
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
## 2014-05-23 NOTE — Progress Notes (Signed)
Rehab Admissions Coordinator Note:  Patient was screened by Trish MageLogue, Quante Pettry M for appropriateness for an Inpatient Acute Rehab Consult.  At this time, we are recommending Inpatient Rehab consult.  Trish MageLogue, Swayze Kozuch M 05/23/2014, 8:49 AM  I can be reached at [phone].
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
## 2014-05-23 NOTE — Progress Notes (Signed)
Rehab admissions - Evaluated for possible admission.  I met with patient and gave him inpatient rehab booklets.  He is interested in inpatient rehab admission.  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  First we will need auth for his acute stay and I have given that information to insurance verification.  Then, we can seek authorization for acute inpatient rehab admission.  I will follow up again in am.  Call me for questions.  #[phone]
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
## 2014-05-23 NOTE — Progress Notes (Signed)
[facility]  Progress Note   LOS: 5 days   Subjective: Pt doing well.  Pain well controlled.  Ribs less tender, but clavicle hurts more.  B/L LE tender.  Tolerating diet, no N/V.  Urinating well, but no BM yet.    Objective: Vital signs in last 24 hours: Temp:  [98.2 F (36.8 C)-98.9 F (37.2 C)] 98.7 F (37.1 C) (11/23 0449) Pulse Rate:  [84-100] 84 (11/23 0449) Resp:  [18] 18 (11/23 0449) BP: (138-139)/(72-79) 138/72 mmHg (11/23 0449) SpO2:  [93 %-96 %] 94 % (11/23 0449) Last BM Date: 05/18/14  Lab Results:  CBC No results for input(s): WBC, HGB, HCT, PLT in the last 72 hours. BMET No results for input(s): NA, K, CL, CO2, GLUCOSE, BUN, CREATININE, CALCIUM in the last 72 hours.  Imaging: No results found.   PE: General: pleasant, WD/WN white male who is laying in bed in NAD HEENT: head is normocephalic, atraumatic. Sclera are noninjected.  Heart: regular, rate, and rhythm. Normal s1,s2. No obvious murmurs, gallops, or rubs noted. Palpable radial and pedal pulses bilaterally Lungs: CTAB, no wheezes, rhonchi, or rales noted. Respiratory effort nonlabored, but low effort. Left chest wall tenderness. IS [phone]. Abd: soft, NT/ND, +BS, no masses, hernias, or organomegaly MS: Left LE with wound vac and dressing, blister noted at the medial aspect of the wound vac sticker, RLE in wrap, distal CSM intact, left clavicle tenderness and surrounding edema less today Skin: warm and dry with no masses, lesions, or rashes Psych: A&Ox3 with an appropriate affect.   Assessment/Plan: [facility] Mult L rib fx - pulm toilet, duo neb Left Pylon fx - S/P ORIF by Dr. Lajoyce Cornersuda, NWB on left lower extremity, wound vac off today Right talar fx - S/P ORIF by Dr. Lajoyce Cornersuda, TDWB on right lower extremity Comminuted L clavicle fx - No plans for OR currently, likely WBAT left arm VTE - SCD's, Lovenox  FEN - carb mod diet, bowel regimen, add dulcolax Dispo -- lives in 1 level  house, but no one home during the day, CIR consult - medically ready for d/c today if approved, ordered trapeze   Candiss NorseMegan Dort, PA-C Pager: [phone] General Trauma PA Pager: [phone]   05/23/2014
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
## 2014-05-24 MED ORDER — DOCUSATE SODIUM 100 MG PO CAPS
200.0000 mg | ORAL_CAPSULE | Freq: Two times a day (BID) | ORAL | Status: DC
  Administered 2014-05-24 – 2014-05-25 (×3): 200 mg via ORAL
  Filled 2014-05-24 (×3): qty 2
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
## 2014-05-24 NOTE — Progress Notes (Signed)
Physical Therapy Treatment Note  Orthostatic Vital signs as follows:    05/24/14 1420 05/24/14 1425 05/24/14 1428  Vital Signs  Pulse Rate 93 (!) 105 (!) 106  BP 133/63 mmHg 114/82 mmHg 126/62 mmHg  Patient Position (if appropriate) Lying Sitting Sitting (after 5 minutes of sitting)     05/24/14 1435  Vital Signs  Pulse Rate 91  BP 130/61 mmHg  Patient Position (if appropriate) Lying    Van ClinesHolly Rebacca Votaw, PT  Acute Rehabilitation Services Pager [phone] Office [phone]
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
## 2014-05-24 NOTE — Plan of Care (Signed)
Problem: Phase III Progression Outcomes Goal: Pain controlled on oral analgesia Outcome: Completed/Met Date Met:  05/24/14 Goal: IV changed to normal saline lock Outcome: Completed/Met Date Met:  05/24/14 Goal: Discharge plan remains appropriate-arrangements made Outcome: Completed/Met Date Met:  05/24/14

## 2014-05-24 NOTE — Progress Notes (Signed)
Patient ID: Lawrence Ward, male   DOB: 1960/07/03, 53 y.o.   MRN: [id number]   LOS: 6 days   Subjective: No c/o, pain controlled.   Objective: Vital signs in last 24 hours: Temp:  [97.9 F (36.6 C)-98.7 F (37.1 C)] 98.5 F (36.9 C) (11/24 0543) Pulse Rate:  [83-93] 83 (11/24 0543) Resp:  [16] 16 (11/23 1218) BP: (133-141)/(65-80) 135/65 mmHg (11/24 0543) SpO2:  [92 %-97 %] 92 % (11/24 0543) Last BM Date: 05/18/14   IS: 1000ml   Physical Exam General appearance: alert and no distress Resp: clear to auscultation bilaterally Cardio: regular rate and rhythm GI: normal findings: bowel sounds normal and soft, non-tender Extremities: NVI   Assessment/Plan: [facility] Mult L rib fx - pulm toilet, duo neb Left Pylon fx - S/P ORIF by Dr. Lajoyce Cornersuda, NWB on left lower extremity Right talar fx - S/P ORIF by Dr. Lajoyce Cornersuda, TDWB on right lower extremity Comminuted L clavicle fx - No plans for OR currently, WBAT FEN - Change to regular diet as I can't find a reason for the carb mod diet in the chart. Increase bowel regimen. VTE - Lovenox  Dispo -- lives in 1 level house, but no one home during the day, CIR consult - medically ready for d/c today if approved    Freeman CaldronMichael J. Adelynne Joerger, PA-C Pager: [phone] General Trauma PA Pager: [phone]  05/24/2014

## 2014-05-24 NOTE — Clinical Social Work Psychosocial (Signed)
Clinical Social Work Department BRIEF PSYCHOSOCIAL ASSESSMENT 05/24/2014  Patient:  Lawrence Ward, UNREIN     Account Number:  [id number]     Admit date:  05/18/2014  Clinical Social Worker:  Delrae Sawyers  Date/Time:  05/24/2014 04:11 PM  Referred by:  Physician  Date Referred:  05/24/2014 Referred for  SNF Placement   Other Referral:   none.   Interview type:  Patient Other interview type:   none.    PSYCHOSOCIAL DATA Living Status:  SIGNIFICANT OTHER Admitted from facility:   Level of care:   Primary support name:  Mickel Baas Primary support relationship to patient:  PARTNER Degree of support available:   Strong support system.    CURRENT CONCERNS Current Concerns  Post-Acute Placement   Other Concerns:   none.    SOCIAL WORK ASSESSMENT / PLAN CSW received referral for possible SNF as an alternative discharge disposition to CIR. CSW met with pt to discuss discharge disposition. Pt stated understanding and agreeable to SNF placement if unable to admit to CIR. Pt states pt lives at home with fiance, Mickel Baas. CSW to continue to follow and assist with discharge planning needs.   Assessment/plan status:  Psychosocial Support/Ongoing Assessment of Needs Other assessment/ plan:   none.   Information/referral to community resources:   [facility] bed offers.    PATIENT'S/FAMILY'S RESPONSE TO PLAN OF CARE: Pt understanding and agreeable to CSW plan of care. Pt expressed no further questions or concerns at this time.       Lubertha Sayres, [place] [phone]) Licensed Clinical Social Worker Orthopedics [phone]) and Surgical [phone])

## 2014-05-24 NOTE — Clinical Social Work Placement (Signed)
Clinical Social Work Department CLINICAL SOCIAL WORK PLACEMENT NOTE 05/24/2014  Patient:  Lawrence Ward,Ayush  Account Number:  [id number] Admit date:  05/18/2014  Clinical Social Worker:  Mosie EpsteinEMILY S Taeden Geller, LCSWA  Date/time:  05/24/2014 12:42 PM  Clinical Social Work is seeking post-discharge placement for this patient at the following level of care:   SKILLED NURSING   (*CSW will update this form in Epic as items are completed)   05/24/2014  Patient/family provided with [facility] of Clinical Social Work's list of facilities offering this level of care within the geographic area requested by the patient (or if unable, by the patient's family).  05/24/2014  Patient/family informed of their freedom to choose among providers that offer the needed level of care, that participate in Medicare, Medicaid or managed care program needed by the patient, have an available bed and are willing to accept the patient.  05/24/2014  Patient/family informed of [facility]' ownership interest in [facility], as well as of the fact that they are under no obligation to receive care at this facility.  PASARR submitted to EDS on 05/24/2014 PASARR number received on 05/24/2014  FL2 transmitted to all facilities in geographic area requested by pt/family on  05/24/2014 FL2 transmitted to all facilities within larger geographic area on   Patient informed that his/her managed care company has contracts with or will negotiate with  certain facilities, including the following:     Patient/family informed of bed offers received:   Patient chooses bed at  Physician recommends and patient chooses bed at    Patient to be transferred to  on   Patient to be transferred to facility by  Patient and family notified of transfer on  Name of family member notified:    The following physician request were entered in Epic:   Additional Comments:  Lily Kochermily Laken Rog, LCSWA [phone]) Licensed Clinical Social  Worker Orthopedics [phone]([id number]) and Surgical [phone]([id number])

## 2014-05-24 NOTE — Progress Notes (Signed)
Rehab admissions - [facility] this am.  I have faxed clinicals to them.  Awaiting call back from insurance carrier about possible acute inpatient rehab admission.  Call me for questions.  #[phone]

## 2014-05-24 NOTE — Progress Notes (Signed)
Patient ID: Lawrence Ward, male   DOB: 1960-08-18, 53 y.o.   MRN: [id number] Incisional wound VAC removed from left ankle yesterday. Patient plan for possible discharge to inpatient rehabilitation. Strict nonweightbearing left lower extremity with weightbearing on the right lower extremity for transfers only.

## 2014-05-24 NOTE — Progress Notes (Signed)
Rehab admissions - I have approval for acute inpatient rehab admission.  I will try to get patient into inpatient rehab in am pending bed availability.  Call me for questions.  #[phone]

## 2014-05-25 ENCOUNTER — Inpatient Hospital Stay (HOSPITAL_COMMUNITY)
Admission: RE | Admit: 2014-05-25 | Discharge: 2014-06-02 | DRG: 556 | Disposition: A | Discharge status: Home Health | Payer: BC Managed Care – PPO | Source: Intra-hospital | Attending: Physical Medicine & Rehabilitation | Admitting: Physical Medicine & Rehabilitation

## 2014-05-25 ENCOUNTER — Encounter (HOSPITAL_COMMUNITY): Payer: Self-pay | Admitting: Orthopedic Surgery

## 2014-05-25 DIAGNOSIS — D72829 Elevated white blood cell count, unspecified: Secondary | ICD-10-CM | POA: Diagnosis present

## 2014-05-25 DIAGNOSIS — S92101D Unspecified fracture of right talus, subsequent encounter for fracture with routine healing: Secondary | ICD-10-CM

## 2014-05-25 DIAGNOSIS — S42022D Displaced fracture of shaft of left clavicle, subsequent encounter for fracture with routine healing: Secondary | ICD-10-CM

## 2014-05-25 DIAGNOSIS — T1490 Injury, unspecified: Secondary | ICD-10-CM | POA: Diagnosis not present

## 2014-05-25 DIAGNOSIS — S42002S Fracture of unspecified part of left clavicle, sequela: Secondary | ICD-10-CM | POA: Diagnosis not present

## 2014-05-25 DIAGNOSIS — S92101A Unspecified fracture of right talus, initial encounter for closed fracture: Secondary | ICD-10-CM | POA: Diagnosis present

## 2014-05-25 DIAGNOSIS — S82832S Other fracture of upper and lower end of left fibula, sequela: Secondary | ICD-10-CM | POA: Diagnosis not present

## 2014-05-25 DIAGNOSIS — M6281 Muscle weakness (generalized): Secondary | ICD-10-CM | POA: Diagnosis present

## 2014-05-25 DIAGNOSIS — S82832A Other fracture of upper and lower end of left fibula, initial encounter for closed fracture: Secondary | ICD-10-CM | POA: Diagnosis present

## 2014-05-25 DIAGNOSIS — T1490XA Injury, unspecified, initial encounter: Secondary | ICD-10-CM

## 2014-05-25 DIAGNOSIS — R42 Dizziness and giddiness: Secondary | ICD-10-CM | POA: Diagnosis present

## 2014-05-25 DIAGNOSIS — F17210 Nicotine dependence, cigarettes, uncomplicated: Secondary | ICD-10-CM | POA: Diagnosis present

## 2014-05-25 DIAGNOSIS — S82302S Unspecified fracture of lower end of left tibia, sequela: Secondary | ICD-10-CM | POA: Diagnosis not present

## 2014-05-25 DIAGNOSIS — S82872D Displaced pilon fracture of left tibia, subsequent encounter for closed fracture with routine healing: Secondary | ICD-10-CM

## 2014-05-25 DIAGNOSIS — L270 Generalized skin eruption due to drugs and medicaments taken internally: Secondary | ICD-10-CM | POA: Diagnosis not present

## 2014-05-25 DIAGNOSIS — S82302A Unspecified fracture of lower end of left tibia, initial encounter for closed fracture: Secondary | ICD-10-CM | POA: Diagnosis present

## 2014-05-25 DIAGNOSIS — Z880 Allergy status to penicillin: Secondary | ICD-10-CM

## 2014-05-25 DIAGNOSIS — R21 Rash and other nonspecific skin eruption: Secondary | ICD-10-CM | POA: Diagnosis present

## 2014-05-25 DIAGNOSIS — S92101S Unspecified fracture of right talus, sequela: Secondary | ICD-10-CM | POA: Diagnosis not present

## 2014-05-25 DIAGNOSIS — S42002A Fracture of unspecified part of left clavicle, initial encounter for closed fracture: Secondary | ICD-10-CM | POA: Diagnosis present

## 2014-05-25 DIAGNOSIS — R262 Difficulty in walking, not elsewhere classified: Secondary | ICD-10-CM | POA: Diagnosis present

## 2014-05-25 DIAGNOSIS — K5900 Constipation, unspecified: Secondary | ICD-10-CM | POA: Diagnosis present

## 2014-05-25 MED ORDER — ENOXAPARIN SODIUM 40 MG/0.4ML ~~LOC~~ SOLN
40.0000 mg | SUBCUTANEOUS | Status: DC
  Administered 2014-05-25 – 2014-06-01 (×8): 40 mg via SUBCUTANEOUS
  Filled 2014-05-25 (×9): qty 0.4

## 2014-05-25 MED ORDER — OXYCODONE-ACETAMINOPHEN 5-325 MG PO TABS
1.0000 | ORAL_TABLET | ORAL | Status: DC | PRN
  Administered 2014-05-25 – 2014-05-26 (×3): 2 via ORAL
  Administered 2014-05-26: 1 via ORAL
  Administered 2014-05-26 – 2014-05-28 (×6): 2 via ORAL
  Administered 2014-05-28 (×2): 1 via ORAL
  Administered 2014-05-28 – 2014-06-02 (×12): 2 via ORAL
  Filled 2014-05-25 (×7): qty 2
  Filled 2014-05-25: qty 1
  Filled 2014-05-25 (×16): qty 2

## 2014-05-25 MED ORDER — ONDANSETRON HCL 4 MG PO TABS: 4.0000 mg | ORAL_TABLET | Freq: Four times a day (QID) | ORAL | Status: DC | PRN

## 2014-05-25 MED ORDER — POLYETHYLENE GLYCOL 3350 17 G PO PACK
17.0000 g | PACK | Freq: Two times a day (BID) | ORAL | Status: DC
  Administered 2014-05-25 – 2014-05-27 (×4): 17 g via ORAL
  Filled 2014-05-25 (×6): qty 1

## 2014-05-25 MED ORDER — OXYCODONE HCL ER 10 MG PO T12A
10.0000 mg | EXTENDED_RELEASE_TABLET | Freq: Every day | ORAL | Status: DC
  Administered 2014-05-25 – 2014-06-01 (×8): 10 mg via ORAL
  Filled 2014-05-25 (×8): qty 1

## 2014-05-25 MED ORDER — TRAZODONE HCL 50 MG PO TABS: 25.0000 mg | ORAL_TABLET | Freq: Every evening | ORAL | Status: DC | PRN

## 2014-05-25 MED ORDER — BISACODYL 10 MG RE SUPP
10.0000 mg | Freq: Every day | RECTAL | Status: DC
  Administered 2014-05-28 – 2014-05-29 (×2): 10 mg via RECTAL
  Filled 2014-05-25 (×4): qty 1

## 2014-05-25 MED ORDER — IPRATROPIUM-ALBUTEROL 0.5-2.5 (3) MG/3ML IN SOLN: 3.0000 mL | RESPIRATORY_TRACT | Status: DC | PRN

## 2014-05-25 MED ORDER — ONDANSETRON HCL 4 MG/2ML IJ SOLN: 4.0000 mg | Freq: Four times a day (QID) | INTRAMUSCULAR | Status: DC | PRN

## 2014-05-25 MED ORDER — ALUM & MAG HYDROXIDE-SIMETH 200-200-20 MG/5ML PO SUSP: 30.0000 mL | ORAL | Status: DC | PRN

## 2014-05-25 MED ORDER — METHOCARBAMOL 500 MG PO TABS
500.0000 mg | ORAL_TABLET | Freq: Four times a day (QID) | ORAL | Status: DC | PRN
  Administered 2014-05-26: 500 mg via ORAL
  Filled 2014-05-25: qty 1

## 2014-05-25 MED ORDER — BISACODYL 10 MG RE SUPP: 10.0000 mg | Freq: Every day | RECTAL | Status: DC | PRN

## 2014-05-25 MED ORDER — ACETAMINOPHEN 325 MG PO TABS
325.0000 mg | ORAL_TABLET | ORAL | Status: DC | PRN
  Administered 2014-05-28: 650 mg via ORAL
  Filled 2014-05-25: qty 2

## 2014-05-25 MED ORDER — FLEET ENEMA 7-19 GM/118ML RE ENEM: 1.0000 | ENEMA | Freq: Once | RECTAL | Status: AC | PRN

## 2014-05-25 MED ORDER — GUAIFENESIN-DM 100-10 MG/5ML PO SYRP: 5.0000 mL | ORAL_SOLUTION | Freq: Four times a day (QID) | ORAL | Status: DC | PRN

## 2014-05-25 MED ORDER — DIPHENHYDRAMINE HCL 12.5 MG/5ML PO ELIX: 12.5000 mg | ORAL_SOLUTION | Freq: Four times a day (QID) | ORAL | Status: DC | PRN

## 2014-05-25 MED ORDER — SILVER SULFADIAZINE 1 % EX CREA
TOPICAL_CREAM | Freq: Every day | CUTANEOUS | Status: DC
  Administered 2014-05-26 – 2014-06-01 (×5): via TOPICAL
  Administered 2014-06-02: 1 via TOPICAL
  Filled 2014-05-25 (×2): qty 85

## 2014-05-25 MED ORDER — BIOTENE DRY MOUTH MT LIQD: 15.0000 mL | OROMUCOSAL | Status: DC | PRN

## 2014-05-25 NOTE — Progress Notes (Signed)
UR completed.  Pt planned for transfer to inpatient rehab today.   Carlyle LipaMichelle Bryson, RN BSN MHA CCM Trauma/Neuro ICU Case Manager [phone]

## 2014-05-25 NOTE — Progress Notes (Signed)
Patient ID: Lawrence Ward, male   DOB: Dec 27, 1960, 53 y.o.   MRN: [id number] Patient's both lower extremity dressings are clean and dry. Anticipate discharge to inpatient rehabilitation.

## 2014-05-25 NOTE — Plan of Care (Signed)
Problem: Consults Goal: General Surgical Patient Education (See Patient Education module for education specifics)  Outcome: Completed/Met Date Met:  05/25/14 Goal: Skin Care Protocol Initiated - if Braden Score 18 or less If consults are not indicated, leave blank or document N/A  Outcome: Completed/Met Date Met:  05/25/14

## 2014-05-25 NOTE — Progress Notes (Signed)
Report called to RN on . Patient being transported to CIR at this time.
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
## 2014-05-25 NOTE — Progress Notes (Signed)
Orthopedic Tech Progress Note Patient Details:  Lawrence Ward 1961/03/20 [id number]  Ortho Devices Ortho Device/Splint Location: applied ohf to bed Ortho Device/Splint Interventions: Ordered, Application   [name], [name] [name] 05/25/2014, 7:39 PM
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
## 2014-05-25 NOTE — Progress Notes (Signed)
Rehab admissions - I met with patient this am.  He is agreeable to inpatient rehab.  Bed available and will admit to acute inpatient rehab today.  Call me for questions.  #[phone]
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
## 2014-05-26 DIAGNOSIS — M79609 Pain in unspecified limb: Secondary | ICD-10-CM

## 2014-05-26 DIAGNOSIS — R609 Edema, unspecified: Secondary | ICD-10-CM

## 2014-05-26 MED ORDER — METHOCARBAMOL 500 MG PO TABS
500.0000 mg | ORAL_TABLET | Freq: Four times a day (QID) | ORAL | Status: DC
  Administered 2014-05-26 – 2014-06-02 (×30): 500 mg via ORAL
  Filled 2014-05-26 (×35): qty 1

## 2014-05-26 NOTE — Progress Notes (Signed)
*  Preliminary Results* Bilateral lower extremity venous duplex completed. Visualized veins of bilateral lower extremities are negative for deep vein thrombosis. There is no evidence of Baker's cyst bilaterally.  05/26/2014  Gertie FeyMichelle Deavin Forst, RVT, RDCS, RDMS

## 2014-05-26 NOTE — Plan of Care (Signed)
Problem: RH BOWEL ELIMINATION Goal: RH STG MANAGE BOWEL WITH ASSISTANCE STG Manage Bowel with min Assistance.  Outcome: Not Progressing Goal: RH STG MANAGE BOWEL W/MEDICATION W/ASSISTANCE STG Manage Bowel with Medication with mod I Assistance.  Outcome: Progressing  Problem: RH SKIN INTEGRITY Goal: RH STG ABLE TO PERFORM INCISION/WOUND CARE W/ASSISTANCE STG Able To Perform Incision/Wound Care With Cues/Able to direct care if not able to complete and lift leg for dressing changes with min Assistance.  Outcome: Progressing  Problem: RH SAFETY Goal: RH STG ADHERE TO SAFETY PRECAUTIONS W/ASSISTANCE/DEVICE STG Adhere to Safety Precautions With cues Assistance/Device.  Outcome: Progressing  Problem: RH PAIN MANAGEMENT Goal: RH STG PAIN MANAGED AT OR BELOW PT'S PAIN GOAL Managed at or below level 5  Outcome: Progressing

## 2014-05-26 NOTE — Progress Notes (Signed)
  [place] PHYSICAL MEDICINE & REHABILITATION     PROGRESS NOTE    Subjective/Complaints: Had spasms last night. Robaxin helped. Pain remains an issue overall. No othoer complaints  Objective: Vital Signs: Blood pressure 130/67, pulse 69, temperature 98.4 F (36.9 C), temperature source Oral, resp. rate 17, SpO2 94 %. No results found. No results for input(s): WBC, HGB, HCT, PLT in the last 72 hours. No results for input(s): NA, K, CL, GLUCOSE, BUN, CREATININE, CALCIUM in the last 72 hours.  Invalid input(s): CO CBG (last 3)  No results for input(s): GLUCAP in the last 72 hours.  Wt Readings from Last 3 Encounters:  05/18/14 99.791 kg (220 lb)    Physical Exam:  Constitutional: He is oriented to person, place, and time. He appears well-developed and well-nourished.  No distress  HENT: oral mucosa pink and moist Head: Normocephalic and atraumatic.  Eyes: Conjunctivae are normal. Pupils are equal, round, and reactive to light.  Neck: Normal range of motion. Neck supple.  Cardiovascular: Normal rate and regular rhythm.  Respiratory: Effort normal and breath sounds normal. No respiratory distress. He exhibits tenderness. Left chest wall and clavicular area with resolving ecchymosis.  GI: Soft. Bowel sounds are normal. He exhibits no distension. There is no tenderness.  Musculoskeletal:  Left foot with  and left shin with sutures, abrasions, some leeding,  and blistering along incision site. Foot with 2+ edema. Right ankle with compressive dressing and foot with trace edema.  Neurological: He is alert and oriented to person, place, and time. Sensation appears normal. MMT limited due to ortho issues. Cognitively he's appropriate Skin: Skin is warm and dry other than injured areas above. Numerous tatoos Psychiatric: His speech is normal and behavior is normal. Thought content normal. His affect is flat. He is cooperative.    Assessment/Plan: 1. Functional deficits  secondary to polytrauma which require 3+ hours per day of interdisciplinary therapy in a comprehensive inpatient rehab setting. Physiatrist is providing close team supervision and 24 hour management of active medical problems listed below. Physiatrist and rehab team continue to assess barriers to discharge/monitor patient progress toward functional and medical goals. FIM:                                  Medical Problem List and Plan: 1. Functional deficits secondary to Polytrauma.  2. DVT Prophylaxis/Anticoagulation: Pharmaceutical: Lovenox 3. Pain Management: Will add OxyContin at bedtime for better pain relief at night and titrate as needed. Continue oxycodone and robaxin prn.  4. Mood: Appears not as anxious today. LCSW to follow for evaluation and support.  5. Neuropsych: This patient is capable of making decisions on his own behalf. 6. Skin/Wound Care: Monitor wounds daily with silvadene to left shin. Routine pressure relief measures and educate patient on boosting--overhead trapeze ordered to help with this.  7. Fluids/Electrolytes/Nutrition: Monitor I/O for adequate intake.  8. Constipation: Increase Miralax to bid and augment further as needed.   LOS (Days) 1 A FACE TO FACE EVALUATION WAS PERFORMED  SWARTZ,ZACHARY T 05/26/2014 7:21 AM

## 2014-05-27 ENCOUNTER — Inpatient Hospital Stay (HOSPITAL_COMMUNITY): Payer: BC Managed Care – PPO | Admitting: *Deleted

## 2014-05-27 ENCOUNTER — Encounter (HOSPITAL_COMMUNITY): Payer: Self-pay

## 2014-05-27 ENCOUNTER — Inpatient Hospital Stay (HOSPITAL_COMMUNITY): Payer: BC Managed Care – PPO | Admitting: Physical Therapy

## 2014-05-27 ENCOUNTER — Inpatient Hospital Stay (HOSPITAL_COMMUNITY): Payer: BC Managed Care – PPO

## 2014-05-27 LAB — CBC WITH DIFFERENTIAL/PLATELET
BASOS PCT: 1 % (ref 0–1)
Basophils Absolute: 0.1 10*3/uL (ref 0.0–0.1)
EOS ABS: 0.8 10*3/uL — AB (ref 0.0–0.7)
Eosinophils Relative: 6 % — ABNORMAL HIGH (ref 0–5)
HCT: 36.9 % — ABNORMAL LOW (ref 39.0–52.0)
Hemoglobin: 11.8 g/dL — ABNORMAL LOW (ref 13.0–17.0)
LYMPHS ABS: 2.4 10*3/uL (ref 0.7–4.0)
Lymphocytes Relative: 18 % (ref 12–46)
MCH: 29.6 pg (ref 26.0–34.0)
MCHC: 32 g/dL (ref 30.0–36.0)
MCV: 92.7 fL (ref 78.0–100.0)
MONO ABS: 1.4 10*3/uL — AB (ref 0.1–1.0)
Monocytes Relative: 11 % (ref 3–12)
NEUTROS PCT: 64 % (ref 43–77)
Neutro Abs: 8.4 10*3/uL — ABNORMAL HIGH (ref 1.7–7.7)
Platelets: 444 10*3/uL — ABNORMAL HIGH (ref 150–400)
RBC: 3.98 MIL/uL — ABNORMAL LOW (ref 4.22–5.81)
RDW: 12.6 % (ref 11.5–15.5)
WBC: 13.1 10*3/uL — ABNORMAL HIGH (ref 4.0–10.5)

## 2014-05-27 LAB — COMPREHENSIVE METABOLIC PANEL
ALT: 72 U/L — AB (ref 0–53)
AST: 38 U/L — ABNORMAL HIGH (ref 0–37)
Albumin: 2.8 g/dL — ABNORMAL LOW (ref 3.5–5.2)
Alkaline Phosphatase: 114 U/L (ref 39–117)
Anion gap: 13 (ref 5–15)
BUN: 16 mg/dL (ref 6–23)
CALCIUM: 9.5 mg/dL (ref 8.4–10.5)
CO2: 25 mEq/L (ref 19–32)
CREATININE: 0.92 mg/dL (ref 0.50–1.35)
Chloride: 102 mEq/L (ref 96–112)
GFR calc non Af Amer: >90 mL/min (ref >90)
GLUCOSE: 109 mg/dL — AB (ref 70–99)
Potassium: 4.6 mEq/L (ref 3.7–5.3)
SODIUM: 140 meq/L (ref 137–147)
Total Bilirubin: 0.4 mg/dL (ref 0.3–1.2)
Total Protein: 6.8 g/dL (ref 6.0–8.3)

## 2014-05-27 MED ORDER — SORBITOL 70 % SOLN
60.0000 mL | Status: AC
  Administered 2014-05-27: 60 mL via ORAL
  Filled 2014-05-27: qty 60

## 2014-05-27 MED ORDER — LORATADINE 10 MG PO TABS
10.0000 mg | ORAL_TABLET | Freq: Every day | ORAL | Status: DC
  Administered 2014-05-27 – 2014-06-02 (×7): 10 mg via ORAL
  Filled 2014-05-27 (×8): qty 1

## 2014-05-27 MED ORDER — TRIAMCINOLONE ACETONIDE 0.5 % EX CREA
TOPICAL_CREAM | Freq: Three times a day (TID) | CUTANEOUS | Status: DC
  Administered 2014-05-27: 22:00:00 via TOPICAL
  Administered 2014-05-27: 1 via TOPICAL
  Administered 2014-05-28 – 2014-06-01 (×12): via TOPICAL
  Administered 2014-06-02: 1 via TOPICAL
  Filled 2014-05-27 (×5): qty 15

## 2014-05-27 MED ORDER — DIPHENHYDRAMINE HCL 50 MG/ML IJ SOLN: 25.0000 mg | Freq: Four times a day (QID) | INTRAMUSCULAR | Status: DC | PRN

## 2014-05-27 MED ORDER — TRIAMCINOLONE ACETONIDE 0.5 % EX OINT
TOPICAL_OINTMENT | Freq: Three times a day (TID) | CUTANEOUS | Status: DC
  Filled 2014-05-27: qty 15

## 2014-05-27 MED ORDER — POLYETHYLENE GLYCOL 3350 17 G PO PACK
17.0000 g | PACK | Freq: Three times a day (TID) | ORAL | Status: DC
  Administered 2014-05-27 – 2014-06-02 (×13): 17 g via ORAL
  Filled 2014-05-27 (×21): qty 1

## 2014-05-27 MED ORDER — DIPHENHYDRAMINE HCL 25 MG PO CAPS
25.0000 mg | ORAL_CAPSULE | Freq: Four times a day (QID) | ORAL | Status: DC | PRN
Start: 2014-05-27 — End: 2014-06-02
  Administered 2014-05-27 – 2014-05-30 (×6): 25 mg via ORAL
  Filled 2014-05-27 (×6): qty 1

## 2014-05-27 NOTE — Progress Notes (Addendum)
2000 pt with generalized red raised rash to back, nipple line to buttocks;around both flank areas and scattered on abd. Also noted on left leg medial aspect and over knee. Face bilat arms clear. Rash not noted to right leg. Po benadryl given.  Pt reports more discomfort with rash especially on right flank; multiple fluid filled blisters noted of varying sizes. Also noted tiny ones on left flank area. Pt also c/o's discomfort to mid back but no blisters noted. Pt given po benadryl and ice pack lightly to right area for relief.

## 2014-05-27 NOTE — Progress Notes (Signed)
[place] PHYSICAL MEDICINE & REHABILITATION     PROGRESS NOTE    Subjective/Complaints: Spasms better. RN reports poor fluid intake and amber colored urine. Patient has questions about ELOS.  Objective: Vital Signs: Blood pressure 143/74, pulse 81, temperature 98.6 F (37 C), temperature source Oral, resp. rate 18, SpO2 94 %. No results found.  Recent Labs  05/27/14 0438  WBC 13.1*  HGB 11.8*  HCT 36.9*  PLT 444*    Recent Labs  05/27/14 0438  NA 140  K 4.6  CL 102  GLUCOSE 109*  BUN 16  CREATININE 0.92  CALCIUM 9.5   CBG (last 3)  No results for input(s): GLUCAP in the last 72 hours.  Wt Readings from Last 3 Encounters:  05/18/14 99.791 kg (220 lb)    Physical Exam:  Constitutional: He is oriented to person, place, and time. He appears well-developed and well-nourished.  No distress  HENT: oral mucosa pink and moist Head: Normocephalic and atraumatic.  Eyes: Conjunctivae are normal. Pupils are equal, round, and reactive to light.  Neck: Normal range of motion. Neck supple.  Cardiovascular: Normal rate and regular rhythm.  Respiratory: Effort normal and breath sounds normal. No respiratory distress. He exhibits tenderness. Left chest wall and clavicular area with resolving ecchymosis.  GI: Soft. Bowel sounds are normal. He exhibits no distension. There is no tenderness.  Musculoskeletal:  Left foot and left shin with sutures, abrasions, mild yellow/green drainage from deeper wounds---area covered with silvadene. Right ankle with compressive dressing and foot with trace edema.  Neurological: He is alert and oriented to person, place, and time. Sensation appears normal. MMT limited due to ortho issues. Cognitively he's appropriate Skin: Skin is warm and dry other than injured areas above. Numerous tatoos Psychiatric: His speech is normal and behavior is normal. Thought content normal. His affect is flat. He is cooperative.    Assessment/Plan: 1.  Functional deficits secondary to polytrauma which require 3+ hours per day of interdisciplinary therapy in a comprehensive inpatient rehab setting. Physiatrist is providing close team supervision and 24 hour management of active medical problems listed below. Physiatrist and rehab team continue to assess barriers to discharge/monitor patient progress toward functional and medical goals. FIM: FIM - Bathing Bathing Steps Patient Completed: Left Arm, Abdomen, Front perineal area, Right upper leg, Left upper leg Bathing: 3: Mod-Patient completes 5-7 [id number] 10 parts or 50-74%                 Comprehension Comprehension Mode: Auditory Comprehension: 7-Follows complex conversation/direction: With no assist  Expression Expression Mode: Verbal Expression: 7-Expresses complex ideas: With no assist  Social Interaction Social Interaction: 7-Interacts appropriately with others - No medications needed.  Problem Solving Problem Solving: 7-Solves complex problems: Recognizes & self-corrects  Memory Memory: 7-Complete Independence: No helper  Medical Problem List and Plan: 1. Functional deficits secondary to Polytrauma.   -left clavicle fracture--WBAT LUE  -multiple rib fractures  -right talar fx and right lateral maleolus fx--TDWB RLE  -distal left tibial metaphyseal fracture--NWB LLE  -left distal tibial intra-articular fracture--NWB LLE  -left lateral malleolar fracture  2. DVT Prophylaxis/Anticoagulation: Pharmaceutical: Lovenox  -  Dopplers PENDING 3. Pain Management: continue OxyContin at bedtime for better pain relief at night and titrate as needed with onset of therapies today. Continue oxycodone and robaxin prn.  4. Mood: Appears not as anxious today. LCSW to follow for evaluation and support.  5. Neuropsych: This patient is capable of making decisions on his own behalf. 6. Skin/Wound Care:  Monitor wounds daily with silvadene to left shin. Routine pressure relief measures  and educate patient on boosting--overhead trapeze ordered to help with this.  7. Fluids/Electrolytes/Nutrition: good solid intake. Recorded liquid intake low  -BMET completely normal  -continue to encourage po fluids 8. Constipation: 60cc sorbitol today. SSE if no results.  9 Persistent leukocytosis: dopplers pending  -check ua and cx  LOS (Days) 2 A FACE TO FACE EVALUATION WAS PERFORMED  Stefannie Defeo T 05/27/2014 7:39 AM

## 2014-05-27 NOTE — Plan of Care (Signed)
Problem: RH SAFETY Goal: RH STG ADHERE TO SAFETY PRECAUTIONS W/ASSISTANCE/DEVICE STG Adhere to Safety Precautions With cues Assistance/Device.  Outcome: Progressing     

## 2014-05-27 NOTE — Plan of Care (Signed)
Problem: RH PAIN MANAGEMENT Goal: RH STG PAIN MANAGED AT OR BELOW PT'S PAIN GOAL Managed at or below level 5  Outcome: Progressing

## 2014-05-27 NOTE — Progress Notes (Signed)
Physical Therapy Session Note  Patient Details  Name: Lawrence Ward MRN: [id number] Date of Birth: Feb 26, 1961  Today's Date: 05/27/2014 PT Individual Time: 1330 (60 min cotreat with OT)-1400 PT Individual Time Calculation (min): 30 min   Short Term Goals: Week 1:  PT Short Term Goal 1 (Week 1): Patient will perform sit<>supine with minA of one person. PT Short Term Goal 2 (Week 1): Patient will perform bed<>wheelchair with use of slideboard and maxA of one person. PT Short Term Goal 3 (Week 1): Patient will propel wheelchair 8450' with minA. PT Short Term Goal 4 (Week 1): Patient will tolerate sitting in chair/wheelchair x2 hours with vitals remaining stable.  Skilled Therapeutic Interventions/Progress Updates:  PM session co treat with OT with focus on bed mobility, transfers OOB, w/c mobility and problem solving home set up and goals.  Pt reporting 5-6/10 pain but premedicated and willing to transfer OOB.  Pt performed supine > sit with +2 present for safety but with trapeze pt able to perform with min A; discussed with pt practicing without trapeze secondary to not having one at home.  Pt reports he [place] a friend in [place] that could make him one if he needed it.  Pt denies dizziness EOB.  Pt educated on use of slideboard; performed slideboard bed > reclining w/c (in case of orthostasis) with +2 for safety with one therapist stabilizing w/c and slideboard and OT providing verbal and tactile cues for sequencing.  In w/c pt performed w/c mobility x 150' in open areas and in smaller ADL apartment with assistance for navigating through more narrow spaces due to ELR and UE fatigue.  Had lengthy discussion with PA regarding possible use of PRAFO to counter act drop foot on LLE to prevent ankle PF contractures; PA to f/u with surgeon regarding PRAFO and PROM on LLE secondary to severity of fractures.  Also had lengthy discussion with pt and OT regarding set up of home and problem solving entering and  set up of bathroom to access shower and toilet.  Pt bathroom door is 23 inches wide door frame <> door frame and pt will likely not fit into 16 in width w/c.  Once pt progressed to Drive manual w/c will measure width to assess accessibility to bathroom.  Pt to have girlfriend take pictures of bathroom for further problem solving.  Pt returned to room to sit up in w/c with all items within reach.     Therapy Documentation Precautions:  Precautions Precautions: Fall Restrictions Weight Bearing Restrictions: Yes LUE Weight Bearing: Weight bearing as tolerated RLE Weight Bearing: Touchdown weight bearing LLE Weight Bearing: Non weight bearing Vital Signs: Therapy Vitals Patient Position (if appropriate): Orthostatic Vitals Oxygen Therapy SpO2: 96 % O2 Device: Not Delivered Pain: Pain Assessment Pain Assessment: 0-10 Pain Score: 5  Faces Pain Scale: Hurts a little bit Pain Type: Acute pain Pain Location: Rib cage Pain Orientation: Right;Left Pain Descriptors / Indicators: Aching;Sore Pain Intervention(s): Other (Comment) (premedicated)  See FIM for current functional status  Therapy/Group: Co-Treatment  Edman CircleHall, Sulamita Lafountain Faucette 05/27/2014, 2:11 PM

## 2014-05-27 NOTE — Progress Notes (Signed)
Patient information reviewed and entered into [facility] system by Latham Kinzler, RN, CRRN, PPS Coordinator.  Information including medical coding and functional independence measure will be reviewed and updated through discharge.    
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
## 2014-05-27 NOTE — Progress Notes (Signed)
Occupational Therapy Session Note  Patient Details  Name: Lawrence Ward MRN: [id number] Date of Birth: 1961/04/23  Today's Date: 05/27/2014 OT Co-Treatment Time: 1300-1330 OT Co-Treatment Time Calculation (min): 30 min   Short Term Goals: Week 1:  OT Short Term Goal 1 (Week 1): Pt will complete upper body bathing and dressing at edge of bed with superision OT Short Term Goal 2 (Week 1): Pt will groom at w/c level with setup assist OT Short Term Goal 3 (Week 1): Pt will perform lateral leans to don pants with mod assist OT Short Term Goal 4 (Week 1): Pt will self-feed independently using adapted silvereware/utensils, prn. OT Short Term Goal 5 (Week 1): Pt will complete slide board transfer to drop-arm commode with mod assist X 1  Skilled Therapeutic Interventions/Progress Updates:    Pt participated in co-tx session with PT.  He was able to transfer supine to sit EOB with min assist, with HOB up 30 degrees and use of the trapeze.  Pt able to scoot to the EOB with close supervision.  Therapist positioned sliding board and pt was able to transfer to the reclining wheelchair with min assist.  Once in the chair he was able to push himself around and in the hallway with supervision.  HR at rest 104 BPM with O2 sats at 94% on room air.  Educated pt on possible AE and DME needs for home.  Pt reports that his bathroom door is 24 inches in the larger bathroom which unfortunately will not allow for a 18 inch wheelchair.  Discussed use of commode bench for toileting as it allows for greater room to perform lateral leans side to side.  Will need to practice to determine which one will fit his needs.  No reported pain in the shoulder or arm with wheelchair mobility.    Therapy Documentation Precautions:  Precautions Precautions: Fall Precaution Comments: VAC dressing L lower leg Restrictions Weight Bearing Restrictions: Yes LUE Weight Bearing: Weight bearing as tolerated RLE Weight Bearing: Touchdown  weight bearing LLE Weight Bearing: Non weight bearing  Pain: Pain Assessment Pain Assessment: 0-10 Pain Score: 5  Faces Pain Scale: Hurts a little bit Pain Type: Acute pain Pain Location: Rib cage Pain Orientation: Right;Left Pain Descriptors / Indicators: Aching;Sore Pain Intervention(s): Other (Comment) (premedicated) PAINAD (Pain Assessment in Advanced Dementia) Breathing: normal ADL: See FIM for current functional status  Therapy/Group: Individual Therapy  Ibtisam Benge OTR/L 05/27/2014, 3:37 PM

## 2014-05-27 NOTE — Plan of Care (Signed)
Problem: RH BOWEL ELIMINATION Goal: RH STG MANAGE BOWEL WITH ASSISTANCE STG Manage Bowel with min. Assistance.  Outcome: Not Progressing     

## 2014-05-27 NOTE — Plan of Care (Signed)
Problem: RH BOWEL ELIMINATION Goal: RH STG MANAGE BOWEL W/MEDICATION W/ASSISTANCE STG Manage Bowel with Medication with mod I Assistance.  Outcome: Not Progressing  Problem: RH SAFETY Goal: RH STG ADHERE TO SAFETY PRECAUTIONS W/ASSISTANCE/DEVICE STG Adhere to Safety Precautions With cues Assistance/Device.  Outcome: Progressing  Problem: RH PAIN MANAGEMENT Goal: RH STG PAIN MANAGED AT OR BELOW PT'S PAIN GOAL Managed at or below level 5  Outcome: Progressing

## 2014-05-27 NOTE — Progress Notes (Signed)
Patient is complaining of lower left flank pain.  Area is tender to touch, red and swollen.  PA notified.

## 2014-05-28 ENCOUNTER — Inpatient Hospital Stay (HOSPITAL_COMMUNITY): Payer: BC Managed Care – PPO | Admitting: Physical Therapy

## 2014-05-28 ENCOUNTER — Inpatient Hospital Stay (HOSPITAL_COMMUNITY): Payer: BC Managed Care – PPO | Admitting: *Deleted

## 2014-05-28 ENCOUNTER — Inpatient Hospital Stay (HOSPITAL_COMMUNITY): Payer: BC Managed Care – PPO | Admitting: Occupational Therapy

## 2014-05-28 LAB — URINALYSIS, ROUTINE W REFLEX MICROSCOPIC
Glucose, UA: NEGATIVE mg/dL
Hgb urine dipstick: NEGATIVE
Ketones, ur: NEGATIVE mg/dL
LEUKOCYTES UA: NEGATIVE
Nitrite: NEGATIVE
Protein, ur: NEGATIVE mg/dL
Specific Gravity, Urine: 1.038 — ABNORMAL HIGH (ref 1.005–1.030)
Urobilinogen, UA: 0.2 mg/dL (ref 0.0–1.0)
pH: 5 (ref 5.0–8.0)

## 2014-05-28 MED ORDER — SORBITOL 70 % SOLN
60.0000 mL | Status: AC
  Administered 2014-05-28: 60 mL via ORAL
  Filled 2014-05-28: qty 60

## 2014-05-28 NOTE — Progress Notes (Signed)
Physical Therapy Session Note  Patient Details  Name: Lawrence Ward MRN: [id number] Date of Birth: 10-22-1960  Today's Date: 05/28/2014 PT Individual Time: 1110-1210 PT Individual Time Calculation (min): 60 min   Short Term Goals: Week 1:  PT Short Term Goal 1 (Week 1): Patient will perform sit<>supine with minA of one person. PT Short Term Goal 2 (Week 1): Patient will perform bed<>wheelchair with use of slideboard and maxA of one person. PT Short Term Goal 3 (Week 1): Patient will propel wheelchair 3750' with minA. PT Short Term Goal 4 (Week 1): Patient will tolerate sitting in chair/wheelchair x2 hours with vitals remaining stable.  Skilled Therapeutic Interventions/Progress Updates:    Therapeutic Activity: PT instructs pt in bed mobility rolling L in bed without rail req +2 assist, R side lie to sit req +2 assist, tot at the legs for pain control and min A at the trunk.  PT instructs pt in scoot transfer with slideboard to R from bed to w/c min A.  PT instructs pt in scoot transfer from w/c to mat to L with slideboard req min A.  PT instructs pt in short sit to long sit transfer on mat req min A for L LE, and in long sit to supine req min A PT instructs pt in supine to long sit transfer with compensation leaning on R elbow to assist req min-mod A, scoot from long to short sit req mod A for B LEs, and scoot transfer with slideboard from mat to w/c to R req CGA-min A (level transfer).   W/C Management: PT instructs pt in w/c propulsion with B UEs x 150' x 2 req SBA for safety.   Therapeutic Exercise: PT instructs pt in mat level exercises: open chain only due to LE precuations: supine hip abduction/adduction with pillow case under foot, SAQ, ankle pumps: AROM x 10 reps each LE  Pt is primarily pain and dizziness limited, but learning to compensate well for functional mobility. PT educated pt to attempt ankle pumps when in bed or up in w/c hourly to assist in preventing blood clots and  pt verbalizes understanding. PT recommends a vestibular evaluation due to pt c/o dizziness during supine to/from sit each time, with dizziness lasting less than 1 minute; this PT does not see nystagmus, but notes that pt was fixing gaze on PT's eyes and so nystagmus could have been suppressed. Continue per PT POC.   Therapy Documentation Precautions:  Precautions Precautions: Fall Precaution Comments: VAC dressing L lower leg Restrictions Weight Bearing Restrictions: Yes LUE Weight Bearing: Weight bearing as tolerated RLE Weight Bearing: Touchdown weight bearing LLE Weight Bearing: Non weight bearing  Pain: Pain Assessment Pain Assessment: 0-10 Pain Score: 6  Pain Type: Acute pain Pain Location: Rib cage Pain Orientation: Left Pain Descriptors / Indicators: Sharp Pain Onset: On-going Pain Intervention(s): Rest Multiple Pain Sites: No   See FIM for current functional status  Therapy/Group: Individual Therapy with [name], PT Tech as +2  [name] 05/28/2014, 11:12 AM
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
## 2014-05-28 NOTE — Progress Notes (Signed)
Occupational Therapy Session Note  Patient Details  Name: Lawrence Ward Kinder MRN: [id number] Date of Birth: 01/29/1961  Today's Date: 05/28/2014 OT Individual Time: 1000-1035 OT Individual Time Calculation (min): 35 min  With 25 minutes missed secondary to increased pain and fatigue.   Short Term Goals: Week 1:  OT Short Term Goal 1 (Week 1): Pt will complete upper body bathing and dressing at edge of bed with superision OT Short Term Goal 2 (Week 1): Pt will groom at w/c level with setup assist OT Short Term Goal 3 (Week 1): Pt will perform lateral leans to don pants with mod assist OT Short Term Goal 4 (Week 1): Pt will self-feed independently using adapted silvereware/utensils, prn. OT Short Term Goal 5 (Week 1): Pt will complete slide board transfer to drop-arm commode with mod assist X 1  Skilled Therapeutic Interventions/Progress Updates:  Upon entering the room, pt supine in bed with 4/10 c/o pain in L side ("ribs"). Pt agreeable to ADL session. Pt required Mod A for supine >sit. Once seated EOB, pt reporting increase pain but continued to work with therapy. Pt washed hair, chest, and abdomen only with set up A. Pt performed grooming with set up A seated on EOB. Pt requiring multiple rest breaks secondary to pain. Pt began reporting 8/10 pain and laying back down in bed due to this increase. OT discussed pain management and how to take medication at the appropriate times in order to assist in managing pain in order to participate in functional activity. Pt verbalized understanding. OT discussed with RN who entered room to educate pt on pain and give pain meds. Pt refusing to continue with OT session at this time secondary to increased pain level. RN present in room upon therapist exit.   Therapy Documentation Precautions:  Precautions Precautions: Fall Precaution Comments: VAC dressing L lower leg Restrictions Weight Bearing Restrictions: Yes LUE Weight Bearing: Weight bearing as  tolerated RLE Weight Bearing: Touchdown weight bearing LLE Weight Bearing: Non weight bearing General: General OT Amount of Missed Time: 25 Minutes ADL: ADL ADL Comments: see FIM  See FIM for current functional status  Therapy/Group: Individual Therapy  Lowella Gripittman, Lavender Stanke L 05/28/2014, 10:55 AM

## 2014-05-28 NOTE — Progress Notes (Signed)
Occupational Therapy Session Note  Patient Details  Name: Lawrence Ward MRN: [id number] Date of Birth: [birth year]  Today's Date: 05/28/2014 OT Individual Time: 1400-1500  (60 min) OT Individual Time Calculation (min): 60 min    Short Term Goals: Week 1:  OT Short Term Goal 1 (Week 1): Pt will complete upper body bathing and dressing at edge of bed with superision OT Short Term Goal 2 (Week 1): Pt will groom at w/c level with setup assist OT Short Term Goal 3 (Week 1): Pt will perform lateral leans to don pants with mod assist OT Short Term Goal 4 (Week 1): Pt will self-feed independently using adapted silvereware/utensils, prn. OT Short Term Goal 5 (Week 1): Pt will complete slide board transfer to drop-arm commode with mod assist X 1 Week 2:     Skilled Therapeutic Interventions/Progress Updates:    Pt. Sitting in wc upon arrival.  Pt. Agreed to address wc mobility, UE exercses, and transfers. Applied rib brace across bottom of wc leg rests for leg support.  Pt stated he felt more comfortable   Pt. Propelled wc to gym with independence.   Addressed UE exercises on right with 2# weight and no weight on LUE.  Did sho press, bicep curls, elbow abduction/adduction in horizontal plane, and shoulder abduction/adduction.  Pt has some dull pain on right clavicle with shoulder adduction in horizontal plane.     Propelled wc back to room.  Transferred from wc back to bed using sliding board with pt moving hips across first and then lifting legs with minimal assist.  Provided all needs in reach.    Therapy Documentation Precautions:  Precautions Precautions: Fall Precaution Comments: VAC dressing L lower leg Restrictions Weight Bearing Restrictions: Yes LUE Weight Bearing: Weight bearing as tolerated RLE Weight Bearing: Touchdown weight bearing LLE Weight Bearing: Non weight bearing     Pain: Pain Assessment Pain Assessment: 0-10 Pain Score: 3 Pain Type: Acute pain Pain Location: Rib  cage Pain Orientation: Left Pain Descriptors / Indicators: Sharp Pain Onset: On-going Pain Intervention(s): Medication (See eMAR) Multiple Pain Sites: No ADL: ADL ADL Comments: see FIM       See FIM for current functional status  Therapy/Group: Individual Therapy  Humberto Sealsdwards, Leeyah Heather J 05/28/2014, 6:20 PM

## 2014-05-28 NOTE — Progress Notes (Signed)
[place] PHYSICAL MEDICINE & REHABILITATION     PROGRESS NOTE    Subjective/Complaints: Rash much better (really came on when up with therapy yesterday apparently). Still no bm.  Objective: Vital Signs: Blood pressure 133/76, pulse 81, temperature 98 F (36.7 C), temperature source Oral, resp. rate 22, height 5\' 11"  (1.803 m), weight 99.791 kg (220 lb), SpO2 96 %. No results found.  Recent Labs  05/27/14 0438  WBC 13.1*  HGB 11.8*  HCT 36.9*  PLT 444*    Recent Labs  05/27/14 0438  NA 140  K 4.6  CL 102  GLUCOSE 109*  BUN 16  CREATININE 0.92  CALCIUM 9.5   CBG (last 3)  No results for input(s): GLUCAP in the last 72 hours.  Wt Readings from Last 3 Encounters:  05/27/14 99.791 kg (220 lb)  05/18/14 99.791 kg (220 lb)    Physical Exam:  Constitutional: He is oriented to person, place, and time. He appears well-developed and well-nourished.  No distress  HENT: oral mucosa pink and moist Head: Normocephalic and atraumatic.  Eyes: Conjunctivae are normal. Pupils are equal, round, and reactive to light.  Neck: Normal range of motion. Neck supple.  Cardiovascular: Normal rate and regular rhythm.  Respiratory: Effort normal and breath sounds normal. No respiratory distress. He exhibits tenderness. Left chest wall and clavicular area with resolving ecchymosis.  GI: Soft. Bowel sounds are normal. He exhibits no distension. There is no tenderness.  Musculoskeletal:  Left foot and left shin with sutures, abrasions, mild yellow/green drainage from deeper wounds---area covered with silvadene. Right ankle with compressive dressing and foot with trace edema.  Neurological: He is alert and oriented to person, place, and time. Sensation appears normal. MMT limited due to ortho issues. Cognitively he's appropriate Skin: Skin is warm and dry other than injured areas above. Numerous tatoos Psychiatric: His speech is normal and behavior is normal. Thought content normal.  His affect is flat. He is cooperative.    Assessment/Plan: 1. Functional deficits secondary to polytrauma which require 3+ hours per day of interdisciplinary therapy in a comprehensive inpatient rehab setting. Physiatrist is providing close team supervision and 24 hour management of active medical problems listed below. Physiatrist and rehab team continue to assess barriers to discharge/monitor patient progress toward functional and medical goals. FIM: FIM - Bathing Bathing Steps Patient Completed: Chest, Left Arm, Abdomen, Front perineal area Bathing: 2: Max-Patient completes 3-4 7034f 10 parts or 25-49%  FIM - Upper Body Dressing/Undressing Upper body dressing/undressing steps patient completed: Thread/unthread right sleeve of pullover shirt/dresss, Thread/unthread left sleeve of pullover shirt/dress, Put head through opening of pull over shirt/dress, Pull shirt over trunk Upper body dressing/undressing: 4: Steadying assist FIM - Lower Body Dressing/Undressing Lower body dressing/undressing: 1: Total-Patient completed less than 25% of tasks  FIM - Toileting Toileting: 1: Total-Patient completed zero steps, helper did all 3  FIM - ArchivistToilet Transfers Toilet Transfers: 0-Activity did not occur  FIM - BankerBed/Chair Transfer Bed/Chair Transfer Assistive Devices: Sliding board, Bed rails (trapeze) Bed/Chair Transfer: 1: Two helpers  FIM - Locomotion: Wheelchair Distance: 150 Locomotion: Wheelchair: 4: Travels 150 ft or more: maneuvers on rugs and over door sillls with minimal assistance (Pt.>75%) FIM - Locomotion: Ambulation Ambulation/Gait Assistance: Not tested (comment) (not tested secondary to WB precautions) Locomotion: Ambulation: 0: Activity did not occur  Comprehension Comprehension Mode: Auditory Comprehension: 7-Follows complex conversation/direction: With no assist  Expression Expression Mode: Verbal Expression: 7-Expresses complex ideas: With no assist  Social  Interaction Social Interaction: 7-Interacts  appropriately with others - No medications needed.  Problem Solving Problem Solving: 5-Solves complex 90% of the time/cues < 10% of the time  Memory Memory: 7-Complete Independence: No helper  Medical Problem List and Plan: 1. Functional deficits secondary to Polytrauma.   -left clavicle fracture--WBAT LUE  -multiple rib fractures  -right talar fx and right lateral maleolus fx--TDWB RLE  -distal left tibial metaphyseal fracture--NWB LLE  -left distal tibial intra-articular fracture--NWB LLE  -left lateral malleolar fracture  2. DVT Prophylaxis/Anticoagulation: Pharmaceutical: Lovenox  -  Dopplers neg 3. Pain Management: continue OxyContin at bedtime for better pain relief at night and titrate as needed with onset of therapies today. Continue oxycodone and robaxin prn.  4. Mood: Appears not as anxious today. LCSW to follow for evaluation and support.  5. Neuropsych: This patient is capable of making decisions on his own behalf. 6. Skin/Wound Care: Monitor wounds daily with silvadene to left shin. Routine pressure relief measures and educate patient on boosting--overhead trapeze ordered to help with this.  7. Fluids/Electrolytes/Nutrition: good solid intake. Recorded liquid intake low  -BMET completely normal  -continue to encourage po fluids 8. Constipation: repeat 60cc sorbitol today. SSE .  9 Persistent leukocytosis: dopplers pending  -  ua neg and cx pending 11. Diffuse maculopapular rash---likely heat/contact rash  -much improved symptoms today  -continue hypoallergenic sheets, claritin, kenalog cream, prn benadryl LOS (Days) 3 A FACE TO FACE EVALUATION WAS PERFORMED  SWARTZ,ZACHARY T 05/28/2014 7:40 AM

## 2014-05-29 ENCOUNTER — Inpatient Hospital Stay (HOSPITAL_COMMUNITY): Payer: BC Managed Care – PPO | Admitting: Occupational Therapy

## 2014-05-29 ENCOUNTER — Inpatient Hospital Stay (HOSPITAL_COMMUNITY): Payer: BC Managed Care – PPO

## 2014-05-29 LAB — URINE CULTURE
COLONY COUNT: NO GROWTH
Culture: NO GROWTH

## 2014-05-29 NOTE — Progress Notes (Signed)
Occupational Therapy Session Note  Patient Details  Name: Lawrence Ward MRN: [id number] Date of Birth: 1960/08/01  Today's Date: 05/29/2014 OT Individual Time: [id number] OT Individual Time Calculation (min): 60 min    Short Term Goals: Week 1:  OT Short Term Goal 1 (Week 1): Pt will complete upper body bathing and dressing at edge of bed with superision OT Short Term Goal 2 (Week 1): Pt will groom at w/c level with setup assist OT Short Term Goal 3 (Week 1): Pt will perform lateral leans to don pants with mod assist OT Short Term Goal 4 (Week 1): Pt will self-feed independently using adapted silvereware/utensils, prn. OT Short Term Goal 5 (Week 1): Pt will complete slide board transfer to drop-arm commode with mod assist X 1  Skilled Therapeutic Interventions/Progress Updates: ADL EOB with focus on lateral leans for peribathing and dressing, patient was able to complete without putting weight through his lower extremities.  He c/o of left collar bone area pain 4/10 and fatigue but was still able to take many rest breaks and deep breathing.   This clinician applied ointment to patient's rash on back, buttocks, left leg and under arms after bathing as educated and instructed by RN.  Patient gave great overall effort during today's session in spite of c/o of constant pain.     Therapy Documentation Precautions:  Precautions Precautions: Fall Precaution Comments: VAC dressing L lower leg Restrictions Weight Bearing Restrictions: Yes LUE Weight Bearing: Weight bearing as tolerated RLE Weight Bearing: Touchdown weight bearing LLE Weight Bearing: Non weight bearing  Pain:4/10 constant aching pain in an area he described as "shoulder blade" but he actually pointed to his left collar bone area.  He stated his RN had already given his pain meds  See FIM for current functional status  Therapy/Group: Individual Therapy  Bud Faceickett, Clotine Heiner [facility] HospitalYeary 05/29/2014, 8:52 AM

## 2014-05-29 NOTE — Progress Notes (Signed)
PHYSICAL MEDICINE & REHABILITATION     PROGRESS NOTE    Subjective/Complaints: Had a reasonable day. Rash continues to improve. Still no bm.  Objective: Vital Signs: Blood pressure 127/72, pulse 80, temperature 98.2 F (36.8 C), temperature source Oral, resp. rate 17, height 5\' 11"  (1.803 m), weight 99.791 kg (220 lb), SpO2 96 %. No results found.  Recent Labs  05/27/14 0438  WBC 13.1*  HGB 11.8*  HCT 36.9*  PLT 444*    Recent Labs  05/27/14 0438  NA 140  K 4.6  CL 102  GLUCOSE 109*  BUN 16  CREATININE 0.92  CALCIUM 9.5   CBG (last 3)  No results for input(s): GLUCAP in the last 72 hours.  Wt Readings from Last 3 Encounters:  05/27/14 99.791 kg (220 lb)  05/18/14 99.791 kg (220 lb)    Physical Exam:  Constitutional: He is oriented to person, place, and time. He appears well-developed and well-nourished.  No distress  HENT: oral mucosa pink and moist Head: Normocephalic and atraumatic.  Eyes: Conjunctivae are normal. Pupils are equal, round, and reactive to light.  Neck: Normal range of motion. Neck supple.  Cardiovascular: Normal rate and regular rhythm.  Respiratory: Effort normal and breath sounds normal. No respiratory distress. He exhibits tenderness. Left chest wall and clavicular area with resolving ecchymosis.  GI: Soft. Bowel sounds are normal. He exhibits no distension. There is no tenderness.  Musculoskeletal:  Left foot and left shin with sutures, abrasions, mild yellow/green drainage from deeper wounds---area covered with silvadene. Right ankle with compressive dressing and foot with trace edema.  Neurological: He is alert and oriented to person, place, and time. Sensation appears normal. MMT limited due to ortho issues. Cognitively he's appropriate Skin: Skin is warm and dry other than injured areas above. Numerous tatoos Psychiatric: His speech is normal and behavior is normal. Thought content normal. His affect is flat. He is  cooperative.    Assessment/Plan: 1. Functional deficits secondary to polytrauma which require 3+ hours per day of interdisciplinary therapy in a comprehensive inpatient rehab setting. Physiatrist is providing close team supervision and 24 hour management of active medical problems listed below. Physiatrist and rehab team continue to assess barriers to discharge/monitor patient progress toward functional and medical goals. FIM: FIM - Bathing Bathing Steps Patient Completed: Chest, Abdomen Bathing: 5: Set-up assist to: Obtain items  FIM - Upper Body Dressing/Undressing Upper body dressing/undressing steps patient completed: Thread/unthread right sleeve of pullover shirt/dresss, Thread/unthread left sleeve of pullover shirt/dress, Put head through opening of pull over shirt/dress, Pull shirt over trunk Upper body dressing/undressing: 4: Steadying assist FIM - Lower Body Dressing/Undressing Lower body dressing/undressing: 1: Total-Patient completed less than 25% of tasks  FIM - Toileting Toileting: 1: Total-Patient completed zero steps, helper did all 3  FIM - ArchivistToilet Transfers Toilet Transfers: 0-Activity did not occur  FIM - BankerBed/Chair Transfer Bed/Chair Transfer Assistive Devices: Arm rests, Sliding board Bed/Chair Transfer: 1: Two helpers, 1: Supine > Sit: Total A (helper does all/Pt. < 25%), 4: Bed > Chair or W/C: Min A (steadying Pt. > 75%), 4: Chair or W/C > Bed: Min A (steadying Pt. > 75%), 3: Sit > Supine: Mod A (lifting assist/Pt. 50-74%/lift 2 legs)  FIM - Locomotion: Wheelchair Distance: 150 Locomotion: Wheelchair: 5: Travels 150 ft or more: maneuvers on rugs and over door sills with supervision, cueing or coaxing FIM - Locomotion: Ambulation Ambulation/Gait Assistance: Not tested (comment) (not tested secondary to WB precautions) Locomotion: Ambulation: 0: Activity  did not occur  Comprehension Comprehension Mode: Auditory Comprehension: 7-Follows complex  conversation/direction: With no assist  Expression Expression Mode: Verbal Expression: 7-Expresses complex ideas: With no assist  Social Interaction Social Interaction: 6-Interacts appropriately with others with medication or extra time (anti-anxiety, antidepressant).  Problem Solving Problem Solving: 5-Solves complex 90% of the time/cues < 10% of the time  Memory Memory: 7-Complete Independence: No helper  Medical Problem List and Plan: 1. Functional deficits secondary to Polytrauma.   -left clavicle fracture--WBAT LUE  -multiple rib fractures  -right talar fx and right lateral maleolus fx--TDWB RLE  -distal left tibial metaphyseal fracture--NWB LLE  -left distal tibial intra-articular fracture--NWB LLE  -left lateral malleolar fracture  2. DVT Prophylaxis/Anticoagulation: Pharmaceutical: Lovenox  -  Dopplers neg 3. Pain Management: continue OxyContin at bedtime for better pain relief at night and titrate as needed with onset of therapies today. Continue oxycodone and robaxin prn.  4. Mood: Appears not as anxious today. LCSW to follow for evaluation and support.  5. Neuropsych: This patient is capable of making decisions on his own behalf. 6. Skin/Wound Care: Monitor wounds daily with silvadene to left shin. Routine pressure relief measures and educate patient on boosting--overhead trapeze ordered to help with this.  7. Fluids/Electrolytes/Nutrition: good solid intake. Recorded liquid intake low  -BMET completely normal  -continue to encourage po fluids 8. Constipation: NEEDS BM TODAY---REVIEWED IMPORTANCE WITH PATIENT.  9 Persistent leukocytosis: dopplers pending  -  ua neg and cx pending 11. Diffuse maculopapular rash---likely heat/contact rash  -improving  -continue hypoallergenic sheets, claritin, kenalog cream, prn benadryl LOS (Days) 4 A FACE TO FACE EVALUATION WAS PERFORMED  Destani Wamser T 05/29/2014 7:38 AM

## 2014-05-29 NOTE — Plan of Care (Signed)
Problem: RH BOWEL ELIMINATION Goal: RH STG MANAGE BOWEL WITH ASSISTANCE STG Manage Bowel with min Assistance.  Outcome: Not Progressing Goal: RH STG MANAGE BOWEL W/MEDICATION W/ASSISTANCE STG Manage Bowel with Medication with mod I Assistance.  Outcome: Not Progressing  Problem: RH SKIN INTEGRITY Goal: RH STG ABLE TO PERFORM INCISION/WOUND CARE W/ASSISTANCE STG Able To Perform Incision/Wound Care With Cues/Able to direct care if not able to complete and lift leg for dressing changes with min Assistance.  Outcome: Progressing  Problem: RH SAFETY Goal: RH STG ADHERE TO SAFETY PRECAUTIONS W/ASSISTANCE/DEVICE STG Adhere to Safety Precautions With cues Assistance/Device.  Outcome: Progressing  Problem: RH PAIN MANAGEMENT Goal: RH STG PAIN MANAGED AT OR BELOW PT'S PAIN GOAL Managed at or below level 5  Outcome: Progressing

## 2014-05-29 NOTE — Progress Notes (Signed)
Physical Therapy Session Note  Patient Details  Name: Lawrence Ward MRN: [id number] Date of Birth: 10-16-1960  Today's Date: 05/29/2014 PT Individual Time: 1002-1102 PT Individual Time Calculation (min): 60 min   Short Term Goals: Week 1:  PT Short Term Goal 1 (Week 1): Patient will perform sit<>supine with minA of one person. PT Short Term Goal 2 (Week 1): Patient will perform bed<>wheelchair with use of slideboard and maxA of one person. PT Short Term Goal 3 (Week 1): Patient will propel wheelchair 3350' with minA. PT Short Term Goal 4 (Week 1): Patient will tolerate sitting in chair/wheelchair x2 hours with vitals remaining stable.  Skilled Therapeutic Interventions/Progress Updates:    Pt received supine in bed, agreeable to participate in therapy. Transfer training with sliding board transfer bed>w/c, w/c<>mat table several times with Min-ModA, pt benefited from cues to lean forward to reduce workload and pain in LUE, and to use R foot on ground as pivot point. Pt had some dizziness with initial supine>sit in hospital bed, but denied further dizziness during session. On mat table, pt moved sit>supine w/ MinA and supine>sit w/ ModA due to rib pain with trunk rotation when coming up to sit. Pt performed open chain strengthening for BLE including x10 SLR, supine hip abduction/adduction. Switched pt into 16x20 wheelchair w/o reclining back for improved efficiency with propulsion (lighter chair, pt able to control brakes). Will trial pt in this chair for today, may switch back for taller back if shorter back causes increased rib pain. Pt propelled w/c around rehab unit in hospital hallway and carpeted surfaces around obstacles with leg rests extended w/ supervision for safety. Pt instructed on leg rest management on w/c, required Min cueing to remove/attach leg rests. Pt left seated in w/c w/ all needs within reach and NT present.   Therapy Documentation Precautions:  Precautions Precautions:  Fall Precaution Comments: VAC dressing L lower leg Restrictions Weight Bearing Restrictions: Yes LUE Weight Bearing: Weight bearing as tolerated RLE Weight Bearing: Touchdown weight bearing LLE Weight Bearing: Non weight bearing General:   Vital Signs: Therapy Vitals Temp: 98.2 F (36.8 C) Temp Source: Oral Pulse Rate: 80 Resp: 17 BP: 127/72 mmHg Oxygen Therapy SpO2: 96 % O2 Device: Not Delivered Pain: Pain Assessment Pain Assessment: 0-10 Pain Score: 2  Pain Type: Surgical pain;Acute pain Pain Location: Ankle Pain Orientation: Right;Left Pain Descriptors / Indicators: Aching Pain Frequency: Intermittent Pain Onset: On-going Pain Intervention(s): Medication (See eMAR) Multiple Pain Sites: No  See FIM for current functional status  Therapy/Group: Individual Therapy  Hosie SpangleGodfrey, Caydon Feasel  Hosie SpangleJess Stavros Cail, PT, DPT 05/29/2014, 7:40 AM

## 2014-05-29 NOTE — Plan of Care (Signed)
Problem: RH SKIN INTEGRITY Goal: RH STG ABLE TO PERFORM INCISION/WOUND CARE W/ASSISTANCE STG Able To Perform Incision/Wound Care With Cues/Able to direct care if not able to complete and lift leg for dressing changes with min Assistance.  Outcome: Progressing

## 2014-05-29 NOTE — Plan of Care (Signed)
Problem: RH SAFETY Goal: RH STG ADHERE TO SAFETY PRECAUTIONS W/ASSISTANCE/DEVICE STG Adhere to Safety Precautions With cues Assistance/Device.  Outcome: Progressing     

## 2014-05-29 NOTE — Plan of Care (Signed)
Problem: RH BOWEL ELIMINATION Goal: RH STG MANAGE BOWEL WITH ASSISTANCE STG Manage Bowel with min. Assistance.  Outcome: Not Progressing     

## 2014-05-29 NOTE — Progress Notes (Addendum)
Suppository given with good result large loose bm. SSE not given.

## 2014-05-29 NOTE — Plan of Care (Signed)
Problem: RH PAIN MANAGEMENT Goal: RH STG PAIN MANAGED AT OR BELOW PT'S PAIN GOAL Managed at or below level 5  Outcome: Progressing     

## 2014-05-29 NOTE — Progress Notes (Signed)
Continues with rash, but feels much better, per patient. Kenalog and microguard powder applied. Fluid filled blisters to right flank. No BM. "Don't worry, I'll go." Using trapeze bar to reposition self in bed. Complains of pain to Left rib cage area and to bilateral ankles. Encouraged to use I.S. Demonstrated to patient to use pillow to brace chest area when coughing. Urine amber, no odor. Alfredo MartinezMurray, Manda Holstad A

## 2014-05-29 NOTE — Progress Notes (Signed)
Patient has refused suppository all day will keep asking.

## 2014-05-29 NOTE — Progress Notes (Signed)
Patient refused suppository this am teaching done

## 2014-05-29 NOTE — Progress Notes (Signed)
Physical Therapy Session Note  Patient Details  Name: Lawrence Ward MRN: [id number] Date of Birth: [birth year]  Today's Date: 05/29/2014 PT Individual Time: 1620-1650 PT Individual Time Calculation (min): 30 min   Short Term Goals: Week 1:  PT Short Term Goal 1 (Week 1): Patient will perform sit<>supine with minA of one person. PT Short Term Goal 2 (Week 1): Patient will perform bed<>wheelchair with use of slideboard and maxA of one person. PT Short Term Goal 3 (Week 1): Patient will propel wheelchair 3650' with minA. PT Short Term Goal 4 (Week 1): Patient will tolerate sitting in chair/wheelchair x2 hours with vitals remaining stable.  Skilled Therapeutic Interventions/Progress Updates:    Pt with some limitations in AAROM secondary to ankle pain, better able to tolerate AROM but with decreased ROM. Pt with noted valsalva strategy with bed level therex, educated on avoidance to optimize motor control strategies.  Therapy Documentation Precautions:  Precautions Precautions: Fall Precaution Comments: VAC dressing L lower leg Restrictions Weight Bearing Restrictions: Yes LUE Weight Bearing: Weight bearing as tolerated (with transfers only) RLE Weight Bearing: Touchdown weight bearing LLE Weight Bearing: Non weight bearing Other Position/Activity Restrictions: Ankle ROM allowed Pain: Pain Assessment Pain Assessment: 0-10 Pain Score: 2  Pain Type: Surgical pain Pain Location: Ankle Pain Orientation: Right;Left Pain Intervention(s):  (graded activity) Other Treatments:  Pt educated on rehab plan, valsalva avoidance and progressing mobility. Pt performs ankle AAROM in all planes followed by AROM in coronal and sagittal planes 3x10. Heel slides performed 2x10 then 2x10 with ventilatory strategies. Hip abd and SLR performed 2x10 with active assist. Facilitation of diaphragm followed by diaphragm breathing.   See FIM for current functional status  Therapy/Group: Individual  Therapy  Christia ReadingKinney, Estephan Gallardo G 05/29/2014, 6:41 PM

## 2014-05-29 NOTE — Progress Notes (Signed)
Occupational Therapy Session Note  Patient Details  Name: Lawrence Ward MRN: [id number] Date of Birth: May 30, 1961  Skilled Therapeutic Interventions/Progress Updates: Patient stated he was too fatigued to do any more therapy today and that he already wheeled himself to 4000 and back and "I gave myself more therapy."   As well, he had visitors with whom he wanted to visit.  Patient missed the 30 minutes of this afternoon scheduled session.    Therapy Documentation Precautions:  Precautions Precautions: Fall Precaution Comments: VAC dressing L lower leg Restrictions Weight Bearing Restrictions: Yes LUE Weight Bearing: Weight bearing as tolerated RLE Weight Bearing: Touchdown weight bearing LLE Weight Bearing: Non weight bearing General: General OT Amount of Missed Time: 30 Minutes (30 min)  Pain:denied  See FIM for current functional status  Therapy/Group: Individual Therapy  Bud Faceickett, Fahd Galea [facility] 05/29/2014, 4:36 PM

## 2014-05-30 ENCOUNTER — Inpatient Hospital Stay (HOSPITAL_COMMUNITY): Payer: BC Managed Care – PPO | Admitting: Occupational Therapy

## 2014-05-30 ENCOUNTER — Inpatient Hospital Stay (HOSPITAL_COMMUNITY): Payer: BC Managed Care – PPO

## 2014-05-30 DIAGNOSIS — L270 Generalized skin eruption due to drugs and medicaments taken internally: Secondary | ICD-10-CM

## 2014-05-30 NOTE — Progress Notes (Signed)
Physical Therapy Session Note  Patient Details  Name: Lawrence Ward MRN: [id number] Date of Birth: [birth year]  Today's Date: 05/30/2014 PT Individual Time: 0800-0900 PT Individual Time Calculation (min): 60 min   Session 2 Time: 1400-1430 Time Calculation (min): 30 min (co-tx w/ OT 1400-1500)  Short Term Goals: Week 1:  PT Short Term Goal 1 (Week 1): Patient will perform sit<>supine with minA of one person. PT Short Term Goal 2 (Week 1): Patient will perform bed<>wheelchair with use of slideboard and maxA of one person. PT Short Term Goal 3 (Week 1): Patient will propel wheelchair 8650' with minA. PT Short Term Goal 4 (Week 1): Patient will tolerate sitting in chair/wheelchair x2 hours with vitals remaining stable.  Skilled Therapeutic Interventions/Progress Updates:    Session 1: Pt received seated EOB, agreeable to participate in therapy. Pt transferred w/ sliding board bed>w/c, w/c<>mat table w/ MinA for keeping LLE off of ground, encouraging anterior lean to reduce workload on L shoulder. Moved sit>supine w/ SBA for cues to manage LLE. AROM for bilateral ankle DF/PF, then ankle circles for edema management and improved ROM. Discussed possibility of PRAFO boot for L foot in order to maintain ROM, will follow up with medical team to ensure appropriateness of boot. Open chain strengthening for BLE strength, 2x10 SAQ, SLR. Noted improved breathing technique with exercises today vs yesterday. Blocked practice sit<>supine x4 going down to L to simulate home environment. Long discussion with pt about setting up bedroom for ease of transfers including moving nightstand, approaching bed in w/c and setting up for transfers. Pt transported back to room via w/c for energy conservation. Pt left seated in w/c w/ all needs within reach.   Session 2: Pt received supine in bed, agreeable to participate in therapy. Co-tx w/ OT Lawrence Ward for shared goals. Session focused on practicing car transfers, ramp  negotiation, simulated household mobility. Discussed ramp with pt and recommended ramp running parallel to porch (pt had suggested that ramp would be angled out from porch to driveway). Pt stated he could have ramp built by friends by Sunday. Will discuss discharge date with treatment team, pt will likely meet goals prior to then. Pt also stated that he would be riding in [employer] truck with running board. Set pt up for transfer with practice car at truck height, pt agreed that slide board transfer would be unsafe into truck due to profoundly unlevel surfaces increasing risk for sliding forward on board. Pt completed x2 car transfers w/ use of sliding board into sedan height practice car, CGA to steady w/c and board and Min to manage LLE into car. Practiced ramp negotiation in w/c, pt able to negotiate up/down ramp w/ CGA-SBA (improved efficiency with ascending ramp backwards). Pt overall supervision to MinGuard for sliding board transfers to level surfaces, will continue to address functional household mobility to different surfaces to maximize functional independence. See OT note for further details of session.    Therapy Documentation Precautions:  Precautions Precautions: Fall Precaution Comments: VAC dressing L lower leg Restrictions Weight Bearing Restrictions: Yes LUE Weight Bearing: Weight bearing as tolerated RLE Weight Bearing: Touchdown weight bearing LLE Weight Bearing: Non weight bearing Other Position/Activity Restrictions: Ankle ROM allowed Pain: AM Session: 2/10, primarily L collarbone/rib pain PM Session: 4/10 collarbone/rib pain  See FIM for current functional status  Therapy/Group: Individual Therapy and Co-Treatment  Lawrence Ward, Lawrence Ward  Lawrence Ward, PT, DPT 05/30/2014, 7:42 AM
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
## 2014-05-30 NOTE — Care Management Note (Signed)
[facility] Individual Statement of Services  Patient Name:  Lawrence Ward  Date:  05/30/2014  Welcome to the [facility].  Our goal is to provide you with an individualized program based on your diagnosis and situation, designed to meet your specific needs.  With this comprehensive rehabilitation program, you will be expected to participate in at least 3 hours of rehabilitation therapies Monday-Friday, with modified therapy programming on the weekends.  Your rehabilitation program will include the following services:  Physical Therapy (PT), Occupational Therapy (OT), 24 hour per day rehabilitation nursing, Case Management (Social Worker), Rehabilitation Medicine, Nutrition Services and Pharmacy Services  Weekly team conferences will be held on Tuesdays to discuss your progress.  Your Social Worker will talk with you frequently to get your input and to update you on team discussions.  Team conferences with you and your family in attendance may also be held.  Expected length of stay: 10 days  Overall anticipated outcome: modified independent @ wheelchair  Depending on your progress and recovery, your program may change. Your Social Worker will coordinate services and will keep you informed of any changes. Your Social Worker's name and contact numbers are listed  below.  The following services may also be recommended but are not provided by the [facility]:   Driving Evaluations  Home Health Rehabiltiation Services  Outpatient Rehabilitation Services  Vocational Rehabilitation   Arrangements will be made to provide these services after discharge if needed.  Arrangements include referral to agencies that provide these services.  Your insurance has been verified to be:  BCBS PPO Your primary doctor is:  None  Pertinent information will be shared with your doctor and your insurance company.  Social Worker:  [name], [name]  [phone] or [phone]) [phone]   Information discussed with and copy given to patient by: [name], [name], 05/30/2014, 6:10 PM

## 2014-05-30 NOTE — Progress Notes (Signed)
Occupational Therapy Session Note  Patient Details  Name: Lawrence Ward MRN: [id number] Date of Birth: 09/15/1960  Today's Date: 05/30/2014 OT Co-Treatment Time: 1430-1500 OT Co-Treatment Time Calculation (min): 30 min   Short Term Goals: Week 1:  OT Short Term Goal 1 (Week 1): Pt will complete upper body bathing and dressing at edge of bed with superision OT Short Term Goal 2 (Week 1): Pt will groom at w/c level with setup assist OT Short Term Goal 3 (Week 1): Pt will perform lateral leans to don pants with mod assist OT Short Term Goal 4 (Week 1): Pt will self-feed independently using adapted silvereware/utensils, prn. OT Short Term Goal 5 (Week 1): Pt will complete slide board transfer to drop-arm commode with mod assist X 1  Skilled Therapeutic Interventions/Progress Updates:    Co-tx with PT during session.  Focused on simulation of bathroom setup during session including pt being able to negotiate his narrow hallway at home and guide his wheelchair into the bathroom.  Pt's hallway width at 34 inches and door opening adjusted to 23-24 inches.  He was able to manage with close supervision.  Pt will be able to roll in forward and over to the toilet for use.  He feels that the drop arm commode will fit over the toilet just fine and he can use the sliding board for transfer.  Returned to his room to practice drop arm wide commode transfer and then perform simulated toilet clothing and hygiene in sitting with lateral leans side to side.  He was able to complete with min guard assist as sliding board needed repositioning when transferring back to the wheelchair as it began sliding around slightly.  See PT notes for further details of session.  Discussed with pt to think about kitchen setup as well as he will be alone during the day.    Therapy Documentation Precautions:  Precautions Precautions: Fall Precaution Comments:    Restrictions Weight Bearing Restrictions: Yes LUE Weight Bearing:  Weight bearing as tolerated RLE Weight Bearing: Touchdown weight bearing LLE Weight Bearing: Non weight bearing Other Position/Activity Restrictions: Ankle ROM allowed  Pain: Pain Assessment Pain Assessment: Faces Pain Score: 4  Faces Pain Scale: Hurts little more Pain Type: Surgical pain Pain Location: Ankle Pain Orientation: Right;Left Pain Intervention(s): Repositioned;Emotional support ADL: See FIM for current functional status  Therapy/Group: Individual Therapy  Maudry Zeidan OTR/L 05/30/2014, 3:28 PM
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
## 2014-05-30 NOTE — Progress Notes (Signed)
Patient and nursing with concerns regarding left shin incision.  Right ankle dressing removed--incision clean and dry with sutures in place and well healed. Left foot edema has decreased but he has increased edema left shin with early blistering and areas of blanchable erythema. Incision with increased dehiscence due to edema but sutures remain intact. Compressive dressing applied from toes up to knee and elevated. Will follow up in am. CBC with diff ordered for follow up.

## 2014-05-30 NOTE — Progress Notes (Signed)
Occupational Therapy Session Note  Patient Details  Name: Lawrence Ward MRN: [id number] Date of Birth: [birth year]  Today's Date: 05/30/2014 OT Individual Time: 4098-11911103-1205 OT Individual Time Calculation (min): 62 min    Short Term Goals: Week 1:  OT Short Term Goal 1 (Week 1): Pt will complete upper body bathing and dressing at edge of bed with superision OT Short Term Goal 2 (Week 1): Pt will groom at w/c level with setup assist OT Short Term Goal 3 (Week 1): Pt will perform lateral leans to don pants with mod assist OT Short Term Goal 4 (Week 1): Pt will self-feed independently using adapted silvereware/utensils, prn. OT Short Term Goal 5 (Week 1): Pt will complete slide board transfer to drop-arm commode with mod assist X 1  Skilled Therapeutic Interventions/Progress Updates:    Pt was able to transfer from wheelchair to EOB with close supervision and use of the sliding board.  Min instructional cueing for placement of board however he was able to place it himself.  Performed UB bathing sitting EOB.  He declined any LB bathing at this time.  Worked on supine to sit and sit to supine with supervision.  Pt still relying slightly on trapeze for supine to sit but he is able to transition to sitting, including bringing his LEs into the bed with supervision.  Practiced toilet transfers to wide drop arm commode and to commode bench.  Pt looked very safe with both and performed them at a supervision level.  Discussed positioning the toilet at the foot of the bed as to allow for greater lateral lean side to side for managing clothing.  Pt transferred back to bed at end of session for resting.  Reported difficulty catching his breath at times.  No dyspnea noted and O2 sats 96% on room air with HR 80 BPM.   Therapy Documentation Precautions:  Precautions Precautions: Fall Precaution Comments: VAC dressing L lower leg Restrictions Weight Bearing Restrictions: Yes LUE Weight Bearing: Weight bearing as  tolerated RLE Weight Bearing: Touchdown weight bearing LLE Weight Bearing: Non weight bearing Other Position/Activity Restrictions: Ankle ROM allowed  Pain: Pain Assessment Pain Assessment: 0-10 Pain Score: 4  Pain Type: Surgical pain Pain Location: Ankle Pain Orientation: Right;Left Pain Intervention(s): Repositioned;Emotional support ADL: See FIM for current functional status  Therapy/Group: Individual Therapy  Pharaoh Pio OTR/L 05/30/2014, 12:46 PM

## 2014-05-30 NOTE — Plan of Care (Signed)
Problem: RH BOWEL ELIMINATION Goal: RH STG MANAGE BOWEL WITH ASSISTANCE STG Manage Bowel with min Assistance.  Outcome: Progressing Goal: RH STG MANAGE BOWEL W/MEDICATION W/ASSISTANCE STG Manage Bowel with Medication with mod I Assistance.  Outcome: Progressing  Problem: RH SKIN INTEGRITY Goal: RH STG ABLE TO PERFORM INCISION/WOUND CARE W/ASSISTANCE STG Able To Perform Incision/Wound Care With Cues/Able to direct care if not able to complete and lift leg for dressing changes with min Assistance.  Outcome: Progressing  Problem: RH SAFETY Goal: RH STG ADHERE TO SAFETY PRECAUTIONS W/ASSISTANCE/DEVICE STG Adhere to Safety Precautions With cues Assistance/Device.  Outcome: Progressing  Problem: RH PAIN MANAGEMENT Goal: RH STG PAIN MANAGED AT OR BELOW PT'S PAIN GOAL Managed at or below level 5  Outcome: Progressing     

## 2014-05-30 NOTE — Progress Notes (Signed)
PRN benadryl given at 1930 & 0324 for complaint of itching. Rash continues, but decreased redness. PRN percocet managing pain. Foam dressing changed to right shin, 2 staples in place. Alfredo MartinezMurray, [place] Korver A

## 2014-05-31 ENCOUNTER — Inpatient Hospital Stay (HOSPITAL_COMMUNITY): Payer: BC Managed Care – PPO | Admitting: Physical Therapy

## 2014-05-31 ENCOUNTER — Encounter (HOSPITAL_COMMUNITY): Payer: BC Managed Care – PPO | Admitting: Occupational Therapy

## 2014-05-31 ENCOUNTER — Inpatient Hospital Stay (HOSPITAL_COMMUNITY): Payer: BC Managed Care – PPO | Admitting: Occupational Therapy

## 2014-05-31 DIAGNOSIS — S82832S Other fracture of upper and lower end of left fibula, sequela: Secondary | ICD-10-CM

## 2014-05-31 DIAGNOSIS — S92101S Unspecified fracture of right talus, sequela: Secondary | ICD-10-CM

## 2014-05-31 DIAGNOSIS — S42002S Fracture of unspecified part of left clavicle, sequela: Secondary | ICD-10-CM

## 2014-05-31 DIAGNOSIS — S82302S Unspecified fracture of lower end of left tibia, sequela: Secondary | ICD-10-CM

## 2014-05-31 DIAGNOSIS — T1490 Injury, unspecified: Secondary | ICD-10-CM

## 2014-05-31 LAB — CBC WITH DIFFERENTIAL/PLATELET
BASOS ABS: 0 10*3/uL (ref 0.0–0.1)
BASOS PCT: 0 % (ref 0–1)
EOS PCT: 8 % — AB (ref 0–5)
Eosinophils Absolute: 0.9 10*3/uL — ABNORMAL HIGH (ref 0.0–0.7)
HEMATOCRIT: 32.3 % — AB (ref 39.0–52.0)
HEMOGLOBIN: 10.3 g/dL — AB (ref 13.0–17.0)
Lymphocytes Relative: 21 % (ref 12–46)
Lymphs Abs: 2.4 10*3/uL (ref 0.7–4.0)
MCH: 29.6 pg (ref 26.0–34.0)
MCHC: 31.9 g/dL (ref 30.0–36.0)
MCV: 92.8 fL (ref 78.0–100.0)
MONOS PCT: 8 % (ref 3–12)
Monocytes Absolute: 1 10*3/uL (ref 0.1–1.0)
Neutro Abs: 7.3 10*3/uL (ref 1.7–7.7)
Neutrophils Relative %: 63 % (ref 43–77)
Platelets: 454 10*3/uL — ABNORMAL HIGH (ref 150–400)
RBC: 3.48 MIL/uL — ABNORMAL LOW (ref 4.22–5.81)
RDW: 13 % (ref 11.5–15.5)
WBC: 11.7 10*3/uL — ABNORMAL HIGH (ref 4.0–10.5)

## 2014-05-31 MED ORDER — CIPROFLOXACIN HCL 250 MG PO TABS
250.0000 mg | ORAL_TABLET | Freq: Two times a day (BID) | ORAL | Status: DC
  Administered 2014-05-31 – 2014-06-02 (×5): 250 mg via ORAL
  Filled 2014-05-31 (×7): qty 1
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
## 2014-05-31 NOTE — Plan of Care (Signed)
Problem: RH BOWEL ELIMINATION Goal: RH STG MANAGE BOWEL WITH ASSISTANCE STG Manage Bowel with min Assistance.  Outcome: Progressing Goal: RH STG MANAGE BOWEL W/MEDICATION W/ASSISTANCE STG Manage Bowel with Medication with mod I Assistance.  Outcome: Progressing  Problem: RH SKIN INTEGRITY Goal: RH STG ABLE TO PERFORM INCISION/WOUND CARE W/ASSISTANCE STG Able To Perform Incision/Wound Care With Cues/Able to direct care if not able to complete and lift leg for dressing changes with min Assistance.  Outcome: Progressing  Problem: RH SAFETY Goal: RH STG ADHERE TO SAFETY PRECAUTIONS W/ASSISTANCE/DEVICE STG Adhere to Safety Precautions With cues Assistance/Device.  Outcome: Progressing  Problem: RH PAIN MANAGEMENT Goal: RH STG PAIN MANAGED AT OR BELOW PT'S PAIN GOAL Managed at or below level 5  Outcome: Progressing     
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
## 2014-05-31 NOTE — Plan of Care (Signed)
Problem: RH BOWEL ELIMINATION Goal: RH STG MANAGE BOWEL WITH ASSISTANCE STG Manage Bowel with min Assistance.  Outcome: Progressing Goal: RH STG MANAGE BOWEL W/MEDICATION W/ASSISTANCE STG Manage Bowel with Medication with mod I Assistance.  Outcome: Progressing

## 2014-05-31 NOTE — Progress Notes (Signed)
Orthopedic Tech Progress Note Patient Details:  Lawrence Ward Helzer Oct 29, 1960 [id number] Adv[facility] called to place brace order. Spoke with Amil AmenJulia. Patient ID: Lawrence Ward Christine, male   DOB: Oct 29, 1960, 53 y.o.   MRN: [id number]   Orie Routsia R Thompson 05/31/2014, 2:45 PM

## 2014-05-31 NOTE — Progress Notes (Addendum)
Occupational Therapy Session Note  Patient Details  Name: Lawrence Ward MRN: [id number] Date of Birth: 1960-11-16  Today's Date: 05/31/2014 OT Individual Time: 1430-1531 OT Individual Time Calculation (min): 61 min    Short Term Goals: Week 1:  OT Short Term Goal 1 (Week 1): Pt will complete upper body bathing and dressing at edge of bed with superision OT Short Term Goal 2 (Week 1): Pt will groom at w/c level with setup assist OT Short Term Goal 3 (Week 1): Pt will perform lateral leans to don pants with mod assist OT Short Term Goal 4 (Week 1): Pt will self-feed independently using adapted silvereware/utensils, prn. OT Short Term Goal 5 (Week 1): Pt will complete slide board transfer to drop-arm commode with mod assist X 1  Skilled Therapeutic Interventions/Progress Updates:  (Make-up session)   Pt performed transfers from wheelchair to tub bench during session.  Overall min assist needed secondary to wheelchair sliding when attempting to get onto the tub bench.  Also noted sliding board moving on top of the tub bench as well.  Feel he will need min assist for this at him if he is allowed to shower.  He will have to place the tub bench in backwards facing away from his controls based on the bathroom setup.  He reports already having a hand held shower head.  Also discussed kitchen setup and use of wheelchair in the kitchen.  He is able to maneuver appropriately to reach items in the refrigerator and lower cabinets.  He does not plan on cooking while his girlfriend is at work and will just eat snacks.    Therapy Documentation Precautions:  Precautions Precautions: Fall Precaution Comments:    Restrictions Weight Bearing Restrictions: Yes LUE Weight Bearing: Weight bearing as tolerated RLE Weight Bearing: Touchdown weight bearing LLE Weight Bearing: Non weight bearing Other Position/Activity Restrictions: Ankle ROM allowed  Pain: Pain Assessment Pain Assessment: Faces Pain Score: 6   Faces Pain Scale: Hurts even more Pain Type: Acute pain Pain Location: Rib cage Pain Intervention(s): Medication (See eMAR);Repositioned;Emotional support ADL: See FIM for current functional status  Therapy/Group: Individual Therapy  Lawrence Ward OTR/L 05/31/2014, 3:44 PM

## 2014-05-31 NOTE — Plan of Care (Signed)
Problem: RH BOWEL ELIMINATION Goal: RH STG MANAGE BOWEL WITH ASSISTANCE STG Manage Bowel with min Assistance.  Outcome: Progressing     

## 2014-05-31 NOTE — Plan of Care (Signed)
Problem: RH SAFETY Goal: RH STG ADHERE TO SAFETY PRECAUTIONS W/ASSISTANCE/DEVICE STG Adhere to Safety Precautions With cues Assistance/Device.  Outcome: Progressing     

## 2014-05-31 NOTE — Plan of Care (Signed)
Problem: RH BOWEL ELIMINATION Goal: RH STG MANAGE BOWEL WITH ASSISTANCE STG Manage Bowel with min Assistance.  Outcome: Progressing Goal: RH STG MANAGE BOWEL W/MEDICATION W/ASSISTANCE STG Manage Bowel with Medication with mod I Assistance.  Outcome: Progressing  Problem: RH SKIN INTEGRITY Goal: RH STG ABLE TO PERFORM INCISION/WOUND CARE W/ASSISTANCE STG Able To Perform Incision/Wound Care With Cues/Able to direct care if not able to complete and lift leg for dressing changes with min Assistance.  Outcome: Progressing  Problem: RH SAFETY Goal: RH STG ADHERE TO SAFETY PRECAUTIONS W/ASSISTANCE/DEVICE STG Adhere to Safety Precautions With cues Assistance/Device.  Outcome: Progressing  Problem: RH PAIN MANAGEMENT Goal: RH STG PAIN MANAGED AT OR BELOW PT'S PAIN GOAL Managed at or below level 5  Outcome: Progressing

## 2014-05-31 NOTE — Progress Notes (Signed)
Social Work Patient ID: Lawrence Ward, male   DOB: [birth year], 53 y.o.   MRN: [id number]   Met with pt following team conference.  Pt aware and agreeable with targeted d/c date of 12/3.  Reports girlfriend will be in contact with me to schedule another PT session to address stairs one more time.  Denies any concerns about d/c.  Aware I will arrange [place] f/u and DME. Continue to follow.  Sulo Janczak, LCSW

## 2014-05-31 NOTE — Plan of Care (Signed)
Problem: RH PAIN MANAGEMENT Goal: RH STG PAIN MANAGED AT OR BELOW PT'S PAIN GOAL Managed at or below level 5  Outcome: Progressing     

## 2014-05-31 NOTE — Progress Notes (Signed)
Occupational Therapy Session Note  Patient Details  Name: Lawrence Ward MRN: [id number] Date of Birth: 09-23-1960  Today's Date: 05/31/2014 OT Individual Time: 1100-1200 OT Individual Time Calculation (min): 60 min    Short Term Goals: Week 1:  OT Short Term Goal 1 (Week 1): Pt will complete upper body bathing and dressing at edge of bed with superision OT Short Term Goal 2 (Week 1): Pt will groom at w/c level with setup assist OT Short Term Goal 3 (Week 1): Pt will perform lateral leans to don pants with mod assist OT Short Term Goal 4 (Week 1): Pt will self-feed independently using adapted silvereware/utensils, prn. OT Short Term Goal 5 (Week 1): Pt will complete slide board transfer to drop-arm commode with mod assist X 1  Skilled Therapeutic Interventions/Progress Updates:    1:1 self care retraining sitting EOB with focus on pain management for left shoulder with adaptive ways to dress and bathe, lateral leans for peri care and clothing management, d/c planning, DME education, w/c setup in prep for transfer, slide board transfer, etc. Pt able to setup slide board himself and transfer with extra time. Pt plans to continue to sponge bathe at home. Pt will need setup with clothing. Pt continues to make good progress towards d/c goals.   Therapy Documentation Precautions:  Precautions Precautions: Fall Precaution Comments:    Restrictions Weight Bearing Restrictions: Yes LUE Weight Bearing: Weight bearing as tolerated RLE Weight Bearing: Touchdown weight bearing LLE Weight Bearing: Non weight bearing Other Position/Activity Restrictions: Ankle ROM allowed Pain:  c/o discomfort in clavicle region - relief with rest ADL: ADL ADL Comments: see FIM  See FIM for current functional status  Therapy/Group: Individual Therapy  Roney MansSmith, Olanna Percifield [facility] [place] 05/31/2014, 2:20 PM
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
## 2014-05-31 NOTE — Progress Notes (Signed)
Physical Therapy Session Note  Patient Details  Name: Lawrence Ward MRN: [id number] Date of Birth: 03/18/1961  Today's Date: 05/31/2014 PT Individual Time:   1258-1400 PT Individual Time Calculation (min): 62 min  Short Term Goals: Week 1:  PT Short Term Goal 1 (Week 1): Patient will perform sit<>supine with minA of one person. PT Short Term Goal 2 (Week 1): Patient will perform bed<>wheelchair with use of slideboard and maxA of one person. PT Short Term Goal 3 (Week 1): Patient will propel wheelchair 5250' with minA. PT Short Term Goal 4 (Week 1): Patient will tolerate sitting in chair/wheelchair x2 hours with vitals remaining stable.  Skilled Therapeutic Interventions/Progress Updates:   Pt received seated in w/c; agreeable to therapy. Significant other present for initial 20 minutes of this session. Pt/spouse report inability to have ramp built until 12/5. Discussed the following with pt and significant other: anticipated assist required at discharge; recommendation for use of smaller car (as pt owns large truck) due to [facility]; plan for bumping w/c up/down 2 stairs to enter home with assistance of 2 persons; and need for hands-on training for w/c bumping and car transfers. Pt/significant other verbalized understanding; spouse to contact CSW Valentina GuLucy to set up family training session.  Remainder of session focused on bed mobility, car transfers, and unlevel transfers. Pt performed w/c mobility >150' in controlled and home environment with bilat UE's and mod I. Pt managed all w/c parts independently with increased time. Pt performed slide board transfer from w/c<>simulated car x2 trials. During initial trial, pt required mod cueing for technique, LE placement with effective return demonstration during subsequent trial. Performed slide board transfers from w/c<>elevated mat table (to simulate home bed) with supervision, min cueing for anterior weight shift during transfer. Pt adhered to all WB  restrictions during transfers without cueing. Transitioned to blocked practice of supine<>sit. Pt required verbal/demonstration cueing to utilize technique that maximized independence with causing least ribcage pain during bed mobility (sit > long sitting > supine; supine > R side lying > sit). Session ended in pt room, where pt was left semi reclined in bed with 3 rails up, bed alarm on, and all needs within reach.  Therapy Documentation Precautions:  Precautions Precautions: Fall Precaution Comments:    Restrictions Weight Bearing Restrictions: Yes LUE Weight Bearing: Weight bearing as tolerated RLE Weight Bearing: Touchdown weight bearing LLE Weight Bearing: Non weight bearing Other Position/Activity Restrictions: Ankle ROM allowed Pain: Pain Assessment Pain Assessment: 0-10 Pain Score: 0-No pain Locomotion : Ambulation Ambulation/Gait Assistance: Not tested (comment) (Secondary to WB restrictions) Wheelchair Mobility Distance: 150   See FIM for current functional status  Therapy/Group: Individual Therapy  Calvert CantorHobble, Gurleen Larrivee A 05/31/2014, 6:24 PM

## 2014-05-31 NOTE — Plan of Care (Signed)
Problem: RH SKIN INTEGRITY Goal: RH STG ABLE TO PERFORM INCISION/WOUND CARE W/ASSISTANCE STG Able To Perform Incision/Wound Care With Cues/Able to direct care if not able to complete and lift leg for dressing changes with min Assistance.  Outcome: Progressing     

## 2014-06-01 ENCOUNTER — Ambulatory Visit (HOSPITAL_COMMUNITY): Payer: BC Managed Care – PPO | Admitting: *Deleted

## 2014-06-01 ENCOUNTER — Inpatient Hospital Stay (HOSPITAL_COMMUNITY): Payer: BC Managed Care – PPO

## 2014-06-01 ENCOUNTER — Encounter (HOSPITAL_COMMUNITY): Payer: BC Managed Care – PPO
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
## 2014-06-01 NOTE — Discharge Instructions (Signed)
Inpatient Rehab Discharge Instructions  Azalee Coursehomas Brisbon Discharge date and time:  06/02/14  Activities/Precautions/ Functional Status: Activity: activity as tolerated--No weight on left leg. Touch down weight on right leg for transfers. Diet: regular diet Wound Care:  Wash with soap and water. Pat dry. Apply silvadene to left shin incision daily and cover with dry dressing. Contact MD if you develop redness, drainage, fever or chills.   Functional status:  ___ No restrictions     ___ Walk up steps independently ___ 24/7 supervision/assistance   ___ Walk up steps with assistance _X__ Intermittent supervision/assistance  ___ Bathe/dress independently ___ Walk with walker     ___ Bathe/dress with assistance ___ Walk Independently    ___ Shower independently ___ Walk with assistance    ___ Shower with assistance _X__ No alcohol     ___ Return to work/school ________    COMMUNITY REFERRALS UPON DISCHARGE:    Home Health:   PT       RN                        [facility]  [facility] Phone: [phone]   Medical Equipment/Items Ordered: wheelchair, cushion, drop arm commode and tub bench                                                     Agency/Supplier: [facility] [phone]     Special Instructions: 1. Keep legs elevated to control swelling.  2. Need to set up with primary care for post hospital follow up in next few weeks.   My questions have been answered and I understand these instructions. I will adhere to these goals and the provided educational materials after my discharge from the hospital.  Patient/Caregiver Signature _______________________________ Date __________  Clinician Signature _______________________________________ Date __________  Please bring this form and your medication list with you to all your follow-up doctor's appointments.

## 2014-06-01 NOTE — Progress Notes (Signed)
Occupational Therapy Session Note  Patient Details  Name: Lawrence Ward MRN: [id number] Date of Birth: 30-Oct-1960  Today's Date: 06/01/2014 OT Individual Time: 0800-0900 OT Individual Time Calculation (min): 60 min    Short Term Goals: Week 1:  OT Short Term Goal 1 (Week 1): Pt will complete upper body bathing and dressing at edge of bed with superision OT Short Term Goal 2 (Week 1): Pt will groom at w/c level with setup assist OT Short Term Goal 3 (Week 1): Pt will perform lateral leans to don pants with mod assist OT Short Term Goal 4 (Week 1): Pt will self-feed independently using adapted silvereware/utensils, prn. OT Short Term Goal 5 (Week 1): Pt will complete slide board transfer to drop-arm commode with mod assist X 1  Skilled Therapeutic Interventions/Progress Updates:    Pt sitting EOB upon arrival and agreeable to participating in therapy. Focus on BADL retraining including bathing and dressing seated EOB.  Pt incorporated lateral leans to bathe buttocks and doff/don pants. Pt requested use of BSC and transferred bed<>drop arm BSC using sliding board.  Pt completed toilet transfer and toileting tasks at mod I.  Focus on activity tolerance, bed mobility, drop arm BSC transfers, and safety awareness.  Therapy Documentation Precautions:  Precautions Precautions: Fall Precaution Comments:    Restrictions Weight Bearing Restrictions: Yes LUE Weight Bearing: Weight bearing as tolerated RLE Weight Bearing: Touchdown weight bearing (During transfers only, per MD orders) LLE Weight Bearing: Non weight bearing Other Position/Activity Restrictions: Ankle ROM allowed   Pain: Pain Assessment Pain Assessment: 0-10 Pain Score: 3  Pain Type: Acute pain Pain Location: Rib cage Pain Orientation: Left Pain Descriptors / Indicators: Aching Pain Onset: On-going Patients Stated Pain Goal: 2 Pain Intervention(s): RN made aware Multiple Pain Sites: No ADL: ADL ADL Comments: see  FIM  See FIM for current functional status  Therapy/Group: Individual Therapy  Rich BraveLanier, Deone Chappell 06/01/2014, 9:02 AM
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
## 2014-06-01 NOTE — Progress Notes (Signed)
Physical Therapy Session Note  Patient Details  Name: Lawrence Ward MRN: [id number] Date of Birth: 1961-02-17  Today's Date: 06/01/2014 PT Individual Time: 1402-1502 PT Individual Time Calculation (min): 60 min   Short Term Goals: Week 1:  PT Short Term Goal 1 (Week 1): Patient will perform sit<>supine with minA of one person. PT Short Term Goal 2 (Week 1): Patient will perform bed<>wheelchair with use of slideboard and maxA of one person. PT Short Term Goal 3 (Week 1): Patient will propel wheelchair 5450' with minA. PT Short Term Goal 4 (Week 1): Patient will tolerate sitting in chair/wheelchair x2 hours with vitals remaining stable.  Skilled Therapeutic Interventions/Progress Updates:    Pt received supine in bed, agreeable to participate in therapy. Session focused on furniture transfers, home exercise program. Pt transferred bed>w/c, w/c<>mat table w/ slide board and SBA to steady wheelchair. In ADL apartment worked through problem solving transfer w/c<>couch. Discussed positioning wheelchair so that pt transferred on slide board to L, allowing pt to use arm rest on R for leverage to initiate transfer, though even with this pt required MinA to complete transfer due to compliant surface and uphill transfer going back to w/c. Recommended to pt that he and his girlfriend test out different furniture when he gets home tomorrow while she is there to see what surfaces he'll be able to transfer to independently and which ones he will need help with.   Educated pt on positioning pillows under upper back and head to provide support for ribs when in supine to decrease pain.  Pt provided with home exercise program to maximize functional gains after discharge. x10 each glute sets, SLR, supine hip abduction/adduction, SAQ (with two towels rolled under knee), and seated LAQ.  Session ended in pt's room, pt left seated in w/c w/ all needs within reach.   Therapy Documentation Precautions:   Precautions Precautions: Fall Precaution Comments:    Restrictions Weight Bearing Restrictions: Yes LUE Weight Bearing: Weight bearing as tolerated RLE Weight Bearing: Touchdown weight bearing LLE Weight Bearing: Non weight bearing Other Position/Activity Restrictions: Ankle ROM allowed Pain:  Intermittent rib pain with mobility Locomotion : Ambulation Ambulation/Gait Assistance: Not tested (comment) (Due to WB precautions)   See FIM for current functional status  Therapy/Group: Individual Therapy  Hosie SpangleGodfrey, Crystie Yanko 06/01/2014, 4:21 PM

## 2014-06-01 NOTE — Plan of Care (Signed)
Problem: RH Balance Goal: LTG: Patient will maintain dynamic sitting balance (OT) LTG: Patient will maintain dynamic sitting balance with assistance during activities of daily living (OT)  Outcome: Completed/Met Date Met:  06/01/14  Problem: RH Eating Goal: LTG Patient will perform eating w/assist, cues/equip (OT) LTG: Patient will perform eating with assist, with/without cues using equipment (OT)  Outcome: Completed/Met Date Met:  06/01/14  Problem: RH Grooming Goal: LTG Patient will perform grooming w/assist,cues/equip (OT) LTG: Patient will perform grooming with assist, with/without cues using equipment (OT)  Outcome: Completed/Met Date Met:  06/01/14  Problem: RH Bathing Goal: LTG Patient will bathe with assist, cues/equipment (OT) LTG: Patient will bathe specified number of body parts with assist with/without cues using equipment (position) (OT)  Outcome: Completed/Met Date Met:  06/01/14  Problem: RH Dressing Goal: LTG Patient will perform upper body dressing (OT) LTG Patient will perform upper body dressing with assist, with/without cues (OT).  Outcome: Completed/Met Date Met:  06/01/14 Goal: LTG Patient will perform lower body dressing w/assist (OT) LTG: Patient will perform lower body dressing with assist, with/without cues in positioning using equipment (OT)  Outcome: Completed/Met Date Met:  06/01/14  Problem: RH Toileting Goal: LTG Patient will perform toileting w/assist, cues/equip (OT) LTG: Patient will perform toiletiing (clothes management/hygiene) with assist, with/without cues using equipment (OT)  Outcome: Completed/Met Date Met:  06/01/14  Problem: RH Toilet Transfers Goal: LTG Patient will perform toilet transfers w/assist (OT) LTG: Patient will perform toilet transfers with assist, with/without cues using equipment (OT)  Outcome: Completed/Met Date Met:  06/01/14  Problem: RH Tub/Shower Transfers Goal: LTG Patient will perform tub/shower transfers w/assist  (OT) LTG: Patient will perform tub/shower transfers with assist, with/without cues using equipment (OT)  Outcome: Completed/Met Date Met:  06/01/14  Problem: RH Awareness Goal: LTG: Patient will demonstrate intellectual/emergent (OT) LTG: Patient will demonstrate intellectual/emergent/anticipatory awareness with assist during a functional activity (OT)  Outcome: Completed/Met Date Met:  06/01/14

## 2014-06-01 NOTE — Plan of Care (Signed)
Problem: RH BOWEL ELIMINATION Goal: RH STG MANAGE BOWEL WITH ASSISTANCE STG Manage Bowel with min Assistance.  Outcome: Completed/Met Date Met:  06/01/14 Goal: RH STG MANAGE BOWEL W/MEDICATION W/ASSISTANCE STG Manage Bowel with Medication with mod I Assistance.  Outcome: Not Met (add Reason) Requires suppository in addition to PO meds   Problem: RH SKIN INTEGRITY Goal: RH STG ABLE TO PERFORM INCISION/WOUND CARE W/ASSISTANCE STG Able To Perform Incision/Wound Care With Cues/Able to direct care if not able to complete and lift leg for dressing changes with min Assistance.  Outcome: Completed/Met Date Met:  06/01/14  Problem: RH SAFETY Goal: RH STG ADHERE TO SAFETY PRECAUTIONS W/ASSISTANCE/DEVICE STG Adhere to Safety Precautions With cues Assistance/Device.  Outcome: Completed/Met Date Met:  06/01/14  Problem: RH PAIN MANAGEMENT Goal: RH STG PAIN MANAGED AT OR BELOW PT'S PAIN GOAL Managed at or below level 5  Outcome: Completed/Met Date Met:  06/01/14

## 2014-06-01 NOTE — Progress Notes (Signed)
Occupational Therapy Discharge Summary  Patient Details  Name: Lawrence Ward MRN: [id number] Date of Birth: 10-15-1960     Patient has met 10 of 10 long term goals due to improved activity tolerance, improved balance, postural control, ability to compensate for deficits and pain management.  Pt made steady progress with BADLs during this admission.  Pt completes bathing and dressing tasks seated EOB utilizing lateral leans for LB clothing management and bathing buttocks.  Pt maintains WBing precautions throughout BADLs and transfers. Pt is mod I for sliding board transfers bed<>drop arm BSC and toileting . Pt's plan is to continue to bathe EOB until he can weight bear through his feet. Patient to discharge at overall mod I to supervision  level.  Patient can verbalize to  care partner (girlfriend) how she can assist him if needed.   Recommendation:  No f/u needed at this time  Equipment: Drop Arm BSC,   Reasons for discharge: treatment goals met and discharge from hospital  Patient/family agrees with progress made and goals achieved: Yes  OT Discharge   Pain Pain Assessment Pain Assessment: 0-10 Pain Score: 3  Pain Type: Acute pain Pain Location: Rib cage Pain Orientation: Left Pain Descriptors / Indicators: Aching Pain Onset: On-going Patients Stated Pain Goal: 2 Pain Intervention(s): RN made aware Multiple Pain Sites: No ADL ADL ADL Comments: see FIM Vision/Perception  Vision- History Baseline Vision/History: Wears glasses Wears Glasses: Reading only Patient Visual Report: No change from baseline Vision- Assessment Vision Assessment?: No apparent visual deficits  Cognition Overall Cognitive Status: Within Functional Limits for tasks assessed Orientation Level: Oriented X4 Attention: Alternating Alternating Attention: Appears intact Memory: Appears intact Awareness: Appears intact Problem Solving: Appears intact Safety/Judgment: Appears  intact Sensation Sensation Light Touch: Appears Intact Stereognosis: Appears Intact Hot/Cold: Appears Intact Proprioception: Appears Intact Motor  Motor Motor: Within Functional Limits    Trunk/Postural Assessment  Cervical Assessment Cervical Assessment: Within Functional Limits Thoracic Assessment Thoracic Assessment: Within Functional Limits Lumbar Assessment Lumbar Assessment: Within Functional Limits Postural Control Postural Control: Within Functional Limits  Balance Static Sitting Balance Static Sitting - Balance Support: No upper extremity supported;Feet unsupported Static Sitting - Level of Assistance: 6: Modified independent (Device/Increase time) Dynamic Sitting Balance Dynamic Sitting - Balance Support: No upper extremity supported;Feet supported Dynamic Sitting - Level of Assistance: 6: Modified independent (Device/Increase time) Extremity/Trunk Assessment RUE Assessment RUE Assessment: Within Functional Limits LUE Assessment LUE Assessment: Within Functional Limits  See FIM for current functional status  [name], [name] 06/01/2014, 9:01 AM

## 2014-06-01 NOTE — Plan of Care (Signed)
Problem: RH BOWEL ELIMINATION Goal: RH STG MANAGE BOWEL W/MEDICATION W/ASSISTANCE STG Manage Bowel with Medication with mod I Assistance.  Outcome: Adequate for Discharge LBM 06/01/2014

## 2014-06-02 ENCOUNTER — Ambulatory Visit (HOSPITAL_COMMUNITY): Payer: BC Managed Care – PPO | Admitting: Physical Therapy

## 2014-06-02 MED ORDER — METHOCARBAMOL 500 MG PO TABS: 500.0000 mg | ORAL_TABLET | Freq: Four times a day (QID) | ORAL | Status: DC | PRN

## 2014-06-02 MED ORDER — OXYCODONE-ACETAMINOPHEN 5-325 MG PO TABS: 1.0000 | ORAL_TABLET | Freq: Four times a day (QID) | ORAL | Status: AC | PRN

## 2014-06-02 MED ORDER — CIPROFLOXACIN HCL 250 MG PO TABS: 250.0000 mg | ORAL_TABLET | Freq: Two times a day (BID) | ORAL | Status: AC

## 2014-06-02 MED ORDER — LORATADINE 10 MG PO TABS: 10.0000 mg | ORAL_TABLET | Freq: Every day | ORAL | Status: DC

## 2014-06-02 MED ORDER — OXYCODONE HCL ER 10 MG PO T12A: 10.0000 mg | EXTENDED_RELEASE_TABLET | Freq: Every day | ORAL | Status: AC

## 2014-06-02 MED ORDER — TRIAMCINOLONE ACETONIDE 0.5 % EX CREA: TOPICAL_CREAM | Freq: Three times a day (TID) | CUTANEOUS | Status: DC

## 2014-06-02 MED ORDER — CIPROFLOXACIN HCL 250 MG PO TABS: 250.0000 mg | ORAL_TABLET | Freq: Two times a day (BID) | ORAL | Status: DC

## 2014-06-02 MED ORDER — SILVER SULFADIAZINE 1 % EX CREA: TOPICAL_CREAM | Freq: Every day | CUTANEOUS | Status: AC

## 2014-06-02 MED ORDER — POLYETHYLENE GLYCOL 3350 17 G PO PACK: 17.0000 g | PACK | Freq: Every day | ORAL | Status: DC | PRN

## 2014-06-02 MED ORDER — LORATADINE 10 MG PO TABS: 10.0000 mg | ORAL_TABLET | Freq: Every day | ORAL | Status: AC

## 2014-06-02 MED ORDER — METHOCARBAMOL 500 MG PO TABS
500.0000 mg | ORAL_TABLET | Freq: Four times a day (QID) | ORAL | Status: AC | PRN
Start: 2014-06-02 — End: ?

## 2014-06-02 MED ORDER — TRIAMCINOLONE ACETONIDE 0.5 % EX CREA: TOPICAL_CREAM | Freq: Three times a day (TID) | CUTANEOUS | Status: AC

## 2014-06-02 MED ORDER — SILVER SULFADIAZINE 1 % EX CREA: TOPICAL_CREAM | Freq: Every day | CUTANEOUS | Status: DC

## 2014-06-02 MED ORDER — ACETAMINOPHEN 325 MG PO TABS: 325.0000 mg | ORAL_TABLET | ORAL | Status: AC | PRN

## 2014-06-02 MED ORDER — POLYETHYLENE GLYCOL 3350 17 G PO PACK: 17.0000 g | PACK | Freq: Every day | ORAL | Status: AC | PRN

## 2014-06-02 NOTE — Progress Notes (Signed)
Physical Therapy Discharge Summary  Patient Details  Name: Lawrence Ward MRN: [id number] Date of Birth: 1961-04-01  Today's Date: 06/02/2014 PT Individual Time: 1000-1100 PT Individual Time Calculation (min): 60 min    Patient has met 8 of 9 long term goals due to improved activity tolerance, improved balance, improved postural control, increased strength, increased range of motion, decreased pain, ability to compensate for deficits and improved coordination.  Patient to discharge at a wheelchair level Modified Independent.   Patient's care partner is independent to provide the necessary physical assistance at discharge.  Reasons goals not met: Pt is unable to complete a slideboard furniture transfer uphill from a low, compliant loveseat surface into the w/c mod I; he req min A.   Recommendation:  Patient will benefit from ongoing skilled PT services in home health setting to continue to advance safe functional mobility, address ongoing impairments in functional mobility from compliant furniture surfaces, progress towards weightbearing when medically cleared by physician, and minimize fall risk.  Equipment: 16x18 manual w/c with elevating legrests and flipback armrests (cushion to be picked up at medical supply store), slideboard, abdominal brace for hammock of lower legs  Reasons for discharge: discharge from hospital  Patient/family agrees with progress made and goals achieved: Yes  PT Interventions Gait Training: Pt's friend and girlfriend present for family training in bumping pt up and down 2 stairs in w/c. PT verbally instructs and then demonstrates how to complete the transfer and has family/friend demonstrate understanding, but truly require another person assist due to the size of this patient and the size of the 2 females assisting. Friend/family agree to get a 3rd person assist, if they need to complete stairs. Family complete bumping pt up/down 2 stairs x 4 reps.   W/C  Management: PT extends pt's elevating legrests and demonstrate to girlfriend how to don abdominal brace around distal posts of legrests to provide a hammock support for pt and girlfriend demonstrates understanding in how to don and doff these. PT also demonstrates how to flip back and remove armrests, swing away and remove/place legrests, and lock/unlock brakes to pt's girlfriend, who verbalizes understanding.   Pt is safe to d/c home. Pt reports ramp has been built to enter home and so pt should not have to be bumped up/down stairs, but they wanted to be trained just in case. Pt will continue to receive HHPT to maximize safety with functional mobility.    PT Discharge Precautions/Restrictions Precautions Precautions: Fall Restrictions Weight Bearing Restrictions: Yes LUE Weight Bearing: Weight bearing as tolerated RLE Weight Bearing: Touchdown weight bearing LLE Weight Bearing: Non weight bearing Other Position/Activity Restrictions: Ankle ROM allowed Pain Pain Assessment Pain Assessment: 0-10 Pain Score: 2  Pain Type: Surgical pain Pain Location: Ankle Pain Orientation: Left Pain Descriptors / Indicators: Aching Pain Onset: On-going Pain Intervention(s): Rest;Repositioned (elevating leg) Multiple Pain Sites: No Vision/Perception  Perception Comments: wfl  Cognition Overall Cognitive Status: Within Functional Limits for tasks assessed Orientation Level: Oriented X4 Attention: Alternating;Focused;Sustained;Selective Focused Attention: Appears intact Sustained Attention: Appears intact Selective Attention: Appears intact Alternating Attention: Appears intact Memory: Appears intact Awareness: Appears intact Problem Solving: Appears intact Safety/Judgment: Appears intact Sensation Sensation Light Touch: Impaired by gross assessment;Impaired Detail Light Touch Impaired Details: Impaired LLE (mid shin) Stereognosis: Not tested Hot/Cold: Not tested Proprioception: Appears  Intact Coordination Gross Motor Movements are Fluid and Coordinated: No Fine Motor Movements are Fluid and Coordinated: Not tested Coordination and Movement Description: limited by pain Motor  Motor Motor: Within Functional Limits  Motor - Discharge Observations: limited by pain - slowed movements, apprehensive  Mobility Bed Mobility Bed Mobility: Supine to Sit Supine to Sit: 6: Modified independent (Device/Increase time) Sitting - Scoot to Edge of Bed: 6: Modified independent (Device/Increase time) Transfers Transfers: Yes Lateral/Scoot Transfers: 6: Modified independent (Device/Increase time) Locomotion  Ambulation Ambulation: No Gait Gait: No Stairs / Additional Locomotion Stairs: Yes Stairs Assistance: 1: +2 Total assist;Other (comment) (+ 3 assist for bumping pt up stairs) Stairs Assistance Details: Manual facilitation for placement Stair Management Technique: Backwards (up/down stairs) Number of Stairs: 3 Height of Stairs: 7 Wheelchair Mobility Wheelchair Mobility: Yes Wheelchair Assistance: 6: Modified independent (Device/Increase time) Environmental health practitioner: Both upper extremities Wheelchair Parts Management: Independent Distance: 300  Trunk/Postural Assessment  Cervical Assessment Cervical Assessment: Within Functional Limits Thoracic Assessment Thoracic Assessment: Within Functional Limits Lumbar Assessment Lumbar Assessment: Within Functional Limits Postural Control Postural Control: Within Functional Limits Postural Limitations: no c/o dizziness on d/c day  Balance Balance Balance Assessed: Yes Static Sitting Balance Static Sitting - Balance Support: Feet unsupported Static Sitting - Level of Assistance: 7: Independent Dynamic Sitting Balance Dynamic Sitting - Balance Support: Feet unsupported;Left upper extremity supported;Right upper extremity supported Dynamic Sitting - Level of Assistance: 6: Modified independent (Device/Increase time) Extremity  Assessment  RUE Assessment RUE Assessment: Within Functional Limits LUE Assessment LUE Assessment: Within Functional Limits LUE AROM (degrees) Overall AROM Left Upper Extremity: Deficits;Due to pain (arm flexion limited to 90 degrees) RLE Assessment RLE Assessment: Exceptions to [facility] RLE AROM (degrees) Overall AROM Right Lower Extremity: Deficits;Due to pain;Due to precautions RLE Overall AROM Comments: ankle movement limited due to wrap RLE Strength RLE Overall Strength: Deficits;Due to precautions RLE Overall Strength Comments: grossly >= 3/5 except ankle 1/5 LLE Assessment LLE Assessment: Exceptions to WFL LLE AROM (degrees) Overall AROM Left Lower Extremity: Deficits;Due to precautions;Due to pain LLE Overall AROM Comments: limited ankle ROM LLE Strength LLE Overall Strength: Deficits;Due to precautions LLE Overall Strength Comments: grossly >= 3/5 except ankle 1/5  See FIM for current functional status  Hyla Coard M 06/02/2014, 11:02 AM

## 2014-06-02 NOTE — Progress Notes (Signed)
Pt discharge to home with girlfriend and friend. Discharge instructions given by PAM. No questions noted. Pt to pick up equipment post discharge from advanced home care.

## 2014-06-02 NOTE — Plan of Care (Signed)
Problem: RH Balance Goal: LTG Patient will maintain dynamic sitting balance (PT) LTG: Patient will maintain dynamic sitting balance with assistance during mobility activities (PT)  Outcome: Completed/Met Date Met:  06/02/14  Problem: RH Bed Mobility Goal: LTG Patient will perform bed mobility with assist (PT) LTG: Patient will perform bed mobility with assistance, with/without cues (PT).  Outcome: Completed/Met Date Met:  06/02/14  Problem: RH Bed to Chair Transfers Goal: LTG Patient will perform bed/chair transfers w/assist (PT) LTG: Patient will perform bed/chair transfers with assistance, with/without cues (PT).  Outcome: Completed/Met Date Met:  06/02/14  Problem: RH Car Transfers Goal: LTG Patient will perform car transfers with assist (PT) LTG: Patient will perform car transfers with assistance (PT).  Outcome: Completed/Met Date Met:  06/02/14  Problem: RH Furniture Transfers Goal: LTG Patient will perform furniture transfers w/assist (OT/PT LTG: Patient will perform furniture transfers with assistance (OT/PT).  Outcome: Not Met (add Reason) Pt req min A to transfer uphill from a compliant surface with slideboard  Problem: RH Wheelchair Mobility Goal: LTG Patient will propel w/c in controlled environment (PT) LTG: Patient will propel wheelchair in controlled environment, # of feet with assist (PT)  Outcome: Completed/Met Date Met:  06/02/14 Goal: LTG Patient will propel w/c in home environment (PT) LTG: Patient will propel wheelchair in home environment, # of feet with assistance (PT).  Outcome: Completed/Met Date Met:  06/02/14 Goal: LTG Patient will propel w/c in community environment (PT) LTG: Patient will propel wheelchair in community environment, # of feet with assist (PT)  Outcome: Completed/Met Date Met:  06/02/14  Problem: RH Other (Specify) Goal: RH LTG Other (Specify) Outcome: Completed/Met Date Met:  06/02/14

## 2014-06-02 NOTE — Progress Notes (Signed)
Social Work  Discharge Note  The overall goal for the admission was met for:   Discharge location: Yes - home with girlfriend and neighbors providing intermittent  Length of Stay: Yes - 8 days  Discharge activity level: Yes - modified independent w/c level  Home/community participation: Yes  Services provided included: MD, RD, PT, OT, RN, TR, Pharmacy and [place]: Private Insurance: BCBS PPO  Follow-up services arranged: Home Health: Therapist, sports, PT via [place], DME: [phone] lightweight w/c with ELRs, cushion, drop arm commode and tub bench via [place] and Patient/Family has no preference for HH/DME agencies  Comments (or additional information):  Patient/Family verbalized understanding of follow-up arrangements: Yes  Individual responsible for coordination of the follow-up plan: patient  Confirmed correct DME delivered: Kana Reimann 06/02/2014    Kimra Kantor

## 2014-06-02 NOTE — Progress Notes (Signed)
[place] PHYSICAL MEDICINE & REHABILITATION     PROGRESS NOTE    Subjective/Complaints: Feeling well. Happy to be going home.   Objective: Vital Signs: Blood pressure 118/78, pulse 76, temperature 98.4 F (36.9 C), temperature source Oral, resp. rate 18, height 5\' 11"  (1.803 m), weight 91.5 kg (201 lb 11.5 oz), SpO2 96 %. No results found.  Recent Labs  05/31/14 0543  WBC 11.7*  HGB 10.3*  HCT 32.3*  PLT 454*   No results for input(s): NA, K, CL, GLUCOSE, BUN, CREATININE, CALCIUM in the last 72 hours.  Invalid input(s): CO CBG (last 3)  No results for input(s): GLUCAP in the last 72 hours.  Wt Readings from Last 3 Encounters:  06/01/14 91.5 kg (201 lb 11.5 oz)  05/18/14 99.791 kg (220 lb)    Physical Exam:  Constitutional: He is oriented to person, place, and time. He appears well-developed and well-nourished.  No distress  HENT: oral mucosa pink and moist Head: Normocephalic and atraumatic.  Eyes: Conjunctivae are normal. Pupils are equal, round, and reactive to light.  Neck: Normal range of motion. Neck supple.  Cardiovascular: Normal rate and regular rhythm.  Respiratory: Effort normal and breath sounds normal. No respiratory distress. He exhibits tenderness. Left chest wall and clavicular area with resolving ecchymosis.  GI: Soft. Bowel sounds are normal. He exhibits no distension. There is no tenderness.  Musculoskeletal:  Left shin incisions healing. Mild drainage. Well approximated. Some erythema medially.  Some erythema medially from tibial incision- reduced in size.  Right ankle with sutures.  Neurological: He is alert and oriented to person, place, and time. Sensation appears normal. MMT limited due to ortho issues. Cognitively he's appropriate Skin: Skin is warm and dry other than injured areas above. Numerous tatoos. Maculopapular rash improving Psychiatric: His speech is normal and behavior is normal. Thought content normal. His affect is flat.  He is cooperative.    Assessment/Plan: 1. Functional deficits secondary to polytrauma which require 3+ hours per day of interdisciplinary therapy in a comprehensive inpatient rehab setting. Physiatrist is providing close team supervision and 24 hour management of active medical problems listed below. Physiatrist and rehab team continue to assess barriers to discharge/monitor patient progress toward functional and medical goals.  Dc today  FIM: FIM - Bathing Bathing Steps Patient Completed: Chest, Left Arm, Abdomen, Front perineal area, Buttocks, Right upper leg, Left upper leg, Right Arm Bathing: 5: Supervision: Safety issues/verbal cues  FIM - Upper Body Dressing/Undressing Upper body dressing/undressing steps patient completed: Thread/unthread right sleeve of pullover shirt/dresss, Thread/unthread left sleeve of pullover shirt/dress, Put head through opening of pull over shirt/dress, Pull shirt over trunk Upper body dressing/undressing: 6: More than reasonable amount of time FIM - Lower Body Dressing/Undressing Lower body dressing/undressing steps patient completed: Thread/unthread right pants leg, Thread/unthread left pants leg, Pull pants up/down, Don/Doff left sock, Don/Doff right sock Lower body dressing/undressing: 5: Set-up assist to: Obtain clothing  FIM - Toileting Toileting steps completed by patient: Adjust clothing prior to toileting, Performs perineal hygiene, Adjust clothing after toileting Toileting: 6: Assistive device: No helper  FIM - Diplomatic Services operational officerToilet Transfers Toilet Transfers Assistive Devices: Bedside commode, Sliding board Toilet Transfers: 6-More than reasonable amt of time, 6-Assistive device: No helper  FIM - BankerBed/Chair Transfer Bed/Chair Transfer Assistive Devices: Sliding board, Arm rests Bed/Chair Transfer: 6: Supine > Sit: No assist, 6: Sit > Supine: No assist, 6: Bed > Chair or W/C: No assist, 6: Chair or W/C > Bed: No assist  FIM - Locomotion:  Wheelchair Distance: 300 Locomotion: Wheelchair: 6: Travels 150 ft or more, turns around, maneuvers to table, bed or toilet, negotiates 3% grade: maneuvers on rugs and over door sills independently FIM - Locomotion: Ambulation Ambulation/Gait Assistance: Not tested (comment) (Due to WB precautions) Locomotion: Ambulation: 0: Activity did not occur  Comprehension Comprehension Mode: Auditory Comprehension: 7-Follows complex conversation/direction: With no assist  Expression Expression Mode: Verbal Expression: 7-Expresses complex ideas: With no assist  Social Interaction Social Interaction: 7-Interacts appropriately with others - No medications needed.  Problem Solving Problem Solving: 7-Solves complex problems: Recognizes & self-corrects  Memory Memory: 7-Complete Independence: No helper  Medical Problem List and Plan: 1. Functional deficits secondary to Polytrauma.   -left clavicle fracture--WBAT LUE  -multiple rib fractures  -right talar fx and right lateral maleolus fx--TDWB RLE  -distal left tibial metaphyseal fracture--NWB LLE  -left distal tibial intra-articular fracture--NWB LLE  -left lateral malleolar fracture  -i have ordered a left PRAFO to help stretch left heel cord.---wear at night. Pt aware 2. DVT Prophylaxis/Anticoagulation: Pharmaceutical: Lovenox--can stop at dc  -  Dopplers neg 3. Pain Management: continue OxyContin at bedtime for better pain relief at night and titrate as needed with onset of therapies today. Continue oxycodone and robaxin prn.  4. Mood: Appears not as anxious today. LCSW to follow for evaluation and support.  5. Neuropsych: This patient is capable of making decisions on his own behalf. 6. Skin/Wound Care: Monitor wounds daily with silvadene to left shin.   -continue keflex for left shin to cover cellulitis--5 more days from dc   -light coating of silvadene over open areas/open incision 7. Fluids/Electrolytes/Nutrition: good solid  intake. Recorded liquid intake low  -BMET completely normal  -continue to encourage po fluids 8. Constipation:  Moving bowels.  9 Persistent leukocytosis: dopplers neg  -  ua neg and cx neg 11. Diffuse maculopapular rash--much improved  LOS (Days) 8 A FACE TO FACE EVALUATION WAS PERFORMED  Shylie Polo T 06/02/2014 8:01 AM
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
## 2014-06-06 ENCOUNTER — Telehealth: Payer: Self-pay | Admitting: *Deleted

## 2014-06-06 NOTE — Telephone Encounter (Signed)
Would like verbal ok for PT 2 times per week for 4 weeks - can leave message on secure voice mail [phone]

## 2014-06-08 NOTE — Telephone Encounter (Signed)
Devoria Albealled Eric and gave OK for PT 2X's per week for 4 weeks
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
